# Patient Record
Sex: Female | Born: 1970 | Race: White | Hispanic: No | Marital: Married | Smoking: Current every day smoker
Health system: Southern US, Community
[De-identification: ages and names within clinical notes are randomized; demographics above are authoritative.]

## PROBLEM LIST (undated history)

## (undated) DIAGNOSIS — M1990 Unspecified osteoarthritis, unspecified site: Secondary | ICD-10-CM

## (undated) DIAGNOSIS — K219 Gastro-esophageal reflux disease without esophagitis: Secondary | ICD-10-CM

## (undated) DIAGNOSIS — J45909 Unspecified asthma, uncomplicated: Secondary | ICD-10-CM

## (undated) DIAGNOSIS — E119 Type 2 diabetes mellitus without complications: Secondary | ICD-10-CM

## (undated) HISTORY — PX: TONSILLECTOMY: SUR1361

---

## 1993-07-26 HISTORY — PX: CARPAL TUNNEL RELEASE: SHX101

## 2000-06-20 ENCOUNTER — Other Ambulatory Visit: Admission: RE | Admit: 2000-06-20 | Discharge: 2000-06-20 | Payer: Self-pay | Admitting: *Deleted

## 2001-08-11 ENCOUNTER — Other Ambulatory Visit: Admission: RE | Admit: 2001-08-11 | Discharge: 2001-08-11 | Payer: Self-pay | Admitting: *Deleted

## 2002-08-29 ENCOUNTER — Other Ambulatory Visit: Admission: RE | Admit: 2002-08-29 | Discharge: 2002-08-29 | Payer: Self-pay | Admitting: Obstetrics & Gynecology

## 2015-06-26 HISTORY — PX: CHOLECYSTECTOMY: SHX55

## 2016-05-04 ENCOUNTER — Other Ambulatory Visit: Payer: Self-pay | Admitting: Pain Medicine

## 2016-05-04 DIAGNOSIS — M545 Low back pain, unspecified: Secondary | ICD-10-CM

## 2016-05-04 DIAGNOSIS — G8929 Other chronic pain: Secondary | ICD-10-CM

## 2016-05-16 ENCOUNTER — Ambulatory Visit
Admission: RE | Admit: 2016-05-16 | Discharge: 2016-05-16 | Disposition: A | Discharge status: Home / Self Care | Payer: 59 | Source: Ambulatory Visit | Attending: Pain Medicine | Admitting: Pain Medicine

## 2016-05-16 DIAGNOSIS — M545 Low back pain, unspecified: Secondary | ICD-10-CM

## 2016-05-16 DIAGNOSIS — G8929 Other chronic pain: Secondary | ICD-10-CM

## 2016-07-27 DIAGNOSIS — M545 Low back pain: Secondary | ICD-10-CM | POA: Diagnosis not present

## 2016-07-27 DIAGNOSIS — Z79899 Other long term (current) drug therapy: Secondary | ICD-10-CM | POA: Diagnosis not present

## 2016-07-27 DIAGNOSIS — G894 Chronic pain syndrome: Secondary | ICD-10-CM | POA: Diagnosis not present

## 2016-07-27 DIAGNOSIS — Z79891 Long term (current) use of opiate analgesic: Secondary | ICD-10-CM | POA: Diagnosis not present

## 2016-07-27 DIAGNOSIS — M47817 Spondylosis without myelopathy or radiculopathy, lumbosacral region: Secondary | ICD-10-CM | POA: Diagnosis not present

## 2016-08-09 DIAGNOSIS — J0100 Acute maxillary sinusitis, unspecified: Secondary | ICD-10-CM | POA: Diagnosis not present

## 2016-08-24 DIAGNOSIS — M47817 Spondylosis without myelopathy or radiculopathy, lumbosacral region: Secondary | ICD-10-CM | POA: Diagnosis not present

## 2016-08-24 DIAGNOSIS — M5137 Other intervertebral disc degeneration, lumbosacral region: Secondary | ICD-10-CM | POA: Diagnosis not present

## 2016-08-24 DIAGNOSIS — Z79891 Long term (current) use of opiate analgesic: Secondary | ICD-10-CM | POA: Diagnosis not present

## 2016-08-24 DIAGNOSIS — G894 Chronic pain syndrome: Secondary | ICD-10-CM | POA: Diagnosis not present

## 2016-08-24 DIAGNOSIS — Z79899 Other long term (current) drug therapy: Secondary | ICD-10-CM | POA: Diagnosis not present

## 2016-09-08 DIAGNOSIS — M19019 Primary osteoarthritis, unspecified shoulder: Secondary | ICD-10-CM | POA: Diagnosis not present

## 2016-09-21 DIAGNOSIS — G894 Chronic pain syndrome: Secondary | ICD-10-CM | POA: Diagnosis not present

## 2016-09-21 DIAGNOSIS — Z79891 Long term (current) use of opiate analgesic: Secondary | ICD-10-CM | POA: Diagnosis not present

## 2016-09-21 DIAGNOSIS — M5137 Other intervertebral disc degeneration, lumbosacral region: Secondary | ICD-10-CM | POA: Diagnosis not present

## 2016-09-21 DIAGNOSIS — M47817 Spondylosis without myelopathy or radiculopathy, lumbosacral region: Secondary | ICD-10-CM | POA: Diagnosis not present

## 2016-09-21 DIAGNOSIS — Z79899 Other long term (current) drug therapy: Secondary | ICD-10-CM | POA: Diagnosis not present

## 2016-10-19 DIAGNOSIS — Z79891 Long term (current) use of opiate analgesic: Secondary | ICD-10-CM | POA: Diagnosis not present

## 2016-10-19 DIAGNOSIS — M5137 Other intervertebral disc degeneration, lumbosacral region: Secondary | ICD-10-CM | POA: Diagnosis not present

## 2016-10-19 DIAGNOSIS — Z79899 Other long term (current) drug therapy: Secondary | ICD-10-CM | POA: Diagnosis not present

## 2016-10-19 DIAGNOSIS — M47817 Spondylosis without myelopathy or radiculopathy, lumbosacral region: Secondary | ICD-10-CM | POA: Diagnosis not present

## 2016-10-19 DIAGNOSIS — G894 Chronic pain syndrome: Secondary | ICD-10-CM | POA: Diagnosis not present

## 2016-11-03 DIAGNOSIS — E1165 Type 2 diabetes mellitus with hyperglycemia: Secondary | ICD-10-CM | POA: Diagnosis not present

## 2016-11-03 DIAGNOSIS — E1159 Type 2 diabetes mellitus with other circulatory complications: Secondary | ICD-10-CM | POA: Diagnosis not present

## 2016-11-03 DIAGNOSIS — I10 Essential (primary) hypertension: Secondary | ICD-10-CM | POA: Diagnosis not present

## 2016-11-03 DIAGNOSIS — E785 Hyperlipidemia, unspecified: Secondary | ICD-10-CM | POA: Diagnosis not present

## 2016-11-16 DIAGNOSIS — G894 Chronic pain syndrome: Secondary | ICD-10-CM | POA: Diagnosis not present

## 2016-11-16 DIAGNOSIS — M545 Low back pain: Secondary | ICD-10-CM | POA: Diagnosis not present

## 2016-11-16 DIAGNOSIS — M47817 Spondylosis without myelopathy or radiculopathy, lumbosacral region: Secondary | ICD-10-CM | POA: Diagnosis not present

## 2016-11-25 DIAGNOSIS — M5137 Other intervertebral disc degeneration, lumbosacral region: Secondary | ICD-10-CM | POA: Diagnosis not present

## 2016-11-25 DIAGNOSIS — M9983 Other biomechanical lesions of lumbar region: Secondary | ICD-10-CM | POA: Diagnosis not present

## 2016-12-10 ENCOUNTER — Other Ambulatory Visit: Payer: Self-pay | Admitting: Neurosurgery

## 2016-12-15 DIAGNOSIS — G894 Chronic pain syndrome: Secondary | ICD-10-CM | POA: Diagnosis not present

## 2016-12-15 DIAGNOSIS — M47817 Spondylosis without myelopathy or radiculopathy, lumbosacral region: Secondary | ICD-10-CM | POA: Diagnosis not present

## 2016-12-15 DIAGNOSIS — M5137 Other intervertebral disc degeneration, lumbosacral region: Secondary | ICD-10-CM | POA: Diagnosis not present

## 2017-01-12 DIAGNOSIS — G894 Chronic pain syndrome: Secondary | ICD-10-CM | POA: Diagnosis not present

## 2017-01-12 DIAGNOSIS — M47817 Spondylosis without myelopathy or radiculopathy, lumbosacral region: Secondary | ICD-10-CM | POA: Diagnosis not present

## 2017-01-12 DIAGNOSIS — M5137 Other intervertebral disc degeneration, lumbosacral region: Secondary | ICD-10-CM | POA: Diagnosis not present

## 2017-01-12 DIAGNOSIS — Z79891 Long term (current) use of opiate analgesic: Secondary | ICD-10-CM | POA: Diagnosis not present

## 2017-01-12 DIAGNOSIS — Z79899 Other long term (current) drug therapy: Secondary | ICD-10-CM | POA: Diagnosis not present

## 2017-01-14 ENCOUNTER — Encounter (HOSPITAL_COMMUNITY)
Admission: RE | Admit: 2017-01-14 | Discharge: 2017-01-14 | Disposition: A | Discharge status: Home / Self Care | Payer: 59 | Source: Ambulatory Visit | Attending: Neurosurgery | Admitting: Neurosurgery

## 2017-01-14 ENCOUNTER — Encounter (HOSPITAL_COMMUNITY): Payer: Self-pay | Admitting: *Deleted

## 2017-01-14 DIAGNOSIS — Z01812 Encounter for preprocedural laboratory examination: Secondary | ICD-10-CM | POA: Diagnosis not present

## 2017-01-14 DIAGNOSIS — Z01810 Encounter for preprocedural cardiovascular examination: Secondary | ICD-10-CM | POA: Diagnosis not present

## 2017-01-14 DIAGNOSIS — M5136 Other intervertebral disc degeneration, lumbar region: Secondary | ICD-10-CM | POA: Insufficient documentation

## 2017-01-14 HISTORY — DX: Unspecified osteoarthritis, unspecified site: M19.90

## 2017-01-14 HISTORY — DX: Unspecified asthma, uncomplicated: J45.909

## 2017-01-14 HISTORY — DX: Gastro-esophageal reflux disease without esophagitis: K21.9

## 2017-01-14 HISTORY — DX: Type 2 diabetes mellitus without complications: E11.9

## 2017-01-14 LAB — TYPE AND SCREEN
ABO/RH(D): A POS
Antibody Screen: NEGATIVE

## 2017-01-14 LAB — GLUCOSE, CAPILLARY: Glucose-Capillary: 115 mg/dL — ABNORMAL HIGH (ref 65–99)

## 2017-01-14 LAB — BASIC METABOLIC PANEL
ANION GAP: 8 (ref 5–15)
BUN: 14 mg/dL (ref 6–20)
CALCIUM: 9.1 mg/dL (ref 8.9–10.3)
CHLORIDE: 105 mmol/L (ref 101–111)
CO2: 23 mmol/L (ref 22–32)
CREATININE: 0.7 mg/dL (ref 0.44–1.00)
GFR calc non Af Amer: >60 mL/min (ref >60)
Glucose, Bld: 87 mg/dL (ref 65–99)
Potassium: 4.2 mmol/L (ref 3.5–5.1)
SODIUM: 136 mmol/L (ref 135–145)

## 2017-01-14 LAB — CBC WITH DIFFERENTIAL/PLATELET
Basophils Absolute: 0 10*3/uL (ref 0.0–0.1)
Basophils Relative: 0 %
EOS ABS: 0.1 10*3/uL (ref 0.0–0.7)
EOS PCT: 1 %
HCT: 47.1 % — ABNORMAL HIGH (ref 36.0–46.0)
Hemoglobin: 15.9 g/dL — ABNORMAL HIGH (ref 12.0–15.0)
LYMPHS ABS: 3.4 10*3/uL (ref 0.7–4.0)
LYMPHS PCT: 32 %
MCH: 30.5 pg (ref 26.0–34.0)
MCHC: 33.8 g/dL (ref 30.0–36.0)
MCV: 90.2 fL (ref 78.0–100.0)
MONO ABS: 0.7 10*3/uL (ref 0.1–1.0)
MONOS PCT: 6 %
Neutro Abs: 6.6 10*3/uL (ref 1.7–7.7)
Neutrophils Relative %: 61 %
PLATELETS: 293 10*3/uL (ref 150–400)
RBC: 5.22 MIL/uL — AB (ref 3.87–5.11)
RDW: 13.2 % (ref 11.5–15.5)
WBC: 10.8 10*3/uL — ABNORMAL HIGH (ref 4.0–10.5)

## 2017-01-14 LAB — ABO/RH: ABO/RH(D): A POS

## 2017-01-14 LAB — SURGICAL PCR SCREEN
MRSA, PCR: NEGATIVE
Staphylococcus aureus: NEGATIVE

## 2017-01-14 LAB — HCG, SERUM, QUALITATIVE: PREG SERUM: NEGATIVE

## 2017-01-14 NOTE — Progress Notes (Signed)
PCP: [facility] in [place], [place]  Fasting sugars 98-110

## 2017-01-14 NOTE — Pre-Procedure Instructions (Addendum)
Carla Parker  01/14/2017      [id number] - [place], [place] - [address] AT [facility] [address] [phone] [place] [address] Phone: [phone] Fax: [phone]    Your procedure is scheduled on Mon. July 2  Report to [facility] Admitting at 6:00 A.M.  Call this number if you have problems the morning of surgery:  [phone]   Remember:  Do not eat food or drink liquids after midnight on Sun. July 1   Take these medicines the morning of surgery with A SIP OF WATER :baclofen if needed, omeprazole(prilosec), oxycodone if needed, requip            1 week prior to surgery stop: advil, motrin, mobic, ibuprofen,BC Powders, Goody's, vitamins/herbal medicines.    How to Manage Your Diabetes Before and After Surgery  Why is it important to control my blood sugar before and after surgery? . Improving blood sugar levels before and after surgery helps healing and can limit problems. . A way of improving blood sugar control is eating a healthy diet by: o  Eating less sugar and carbohydrates o  Increasing activity/exercise o  Talking with your doctor about reaching your blood sugar goals . High blood sugars (greater than 180 mg/dL) can raise your risk of infections and slow your recovery, so you will need to focus on controlling your diabetes during the weeks before surgery. . Make sure that the doctor who takes care of your diabetes knows about your planned surgery including the date and location.  How do I manage my blood sugar before surgery? . Check your blood sugar at least 4 times a day, starting 2 days before surgery, to make sure that the level is not too high or low. o Check your blood sugar the morning of your surgery when you wake up and every 2 hours until you get to the Short Stay unit. . If your blood sugar is less than 70 mg/dL, you will need to treat for low blood sugar: o Do not take insulin. o Treat a low blood sugar  (less than 70 mg/dL) with  cup of clear juice (cranberry or apple), 4 glucose tablets, OR glucose gel. o Recheck blood sugar in 15 minutes after treatment (to make sure it is greater than 70 mg/dL). If your blood sugar is not greater than 70 mg/dL on recheck, call [phone] for further instructions. . Report your blood sugar to the short stay nurse when you get to Short Stay.  . If you are admitted to the hospital after surgery: o Your blood sugar will be checked by the staff and you will probably be given insulin after surgery (instead of oral diabetes medicines) to make sure you have good blood sugar levels. o The goal for blood sugar control after surgery is 80-180 mg/dL.       WHAT DO I DO ABOUT MY DIABETES MEDICATION?   Marland Kitchen Do not take oral diabetes medicines (pills) the morning of surgery.       Do not wear jewelry, make-up or nail polish.  Do not wear lotions, powders, or perfumes, or deoderant.  Do not shave 48 hours prior to surgery.  Men may shave face and neck.  Do not bring valuables to the hospital.  [facility] is not responsible for any belongings or valuables.  Contacts, dentures or bridgework may not be worn into surgery.  Leave your suitcase in the car.  After surgery it may be brought to your room.  For patients admitted to the hospital, discharge time will be determined by your treatment team.  Patients discharged the day of surgery will not be allowed to drive home.    Special instructions:   [place]- Preparing For Surgery  Before surgery, you can play an important role. Because skin is not sterile, your skin needs to be as free of germs as possible. You can reduce the number of germs on your skin by washing with CHG (chlorahexidine gluconate) Soap before surgery.  CHG is an antiseptic cleaner which kills germs and bonds with the skin to continue killing germs even after washing.  Please do not use if you have an allergy to CHG or antibacterial soaps.  If your skin becomes reddened/irritated stop using the CHG.  Do not shave (including legs and underarms) for at least 48 hours prior to first CHG shower. It is OK to shave your face.  Please follow these instructions carefully.   1. Shower the NIGHT BEFORE SURGERY and the MORNING OF SURGERY with CHG.   2. If you chose to wash your hair, wash your hair first as usual with your normal shampoo.  3. After you shampoo, rinse your hair and body thoroughly to remove the shampoo.  4. Use CHG as you would any other liquid soap. You can apply CHG directly to the skin and wash gently with a scrungie or a clean washcloth.   5. Apply the CHG Soap to your body ONLY FROM THE NECK DOWN.  Do not use on open wounds or open sores. Avoid contact with your eyes, ears, mouth and genitals (private parts). Wash genitals (private parts) with your normal soap.  6. Wash thoroughly, paying special attention to the area where your surgery will be performed.  7. Thoroughly rinse your body with warm water from the neck down.  8. DO NOT shower/wash with your normal soap after using and rinsing off the CHG Soap.  9. Pat yourself dry with a CLEAN TOWEL.   10. Wear CLEAN PAJAMAS   11. Place CLEAN SHEETS on your bed the night of your first shower and DO NOT SLEEP WITH PETS.    Day of Surgery: Do not apply any deodorants/lotions. Please wear clean clothes to the hospital/surgery center.      Please read over the following fact sheets that you were given. Coughing and Deep Breathing, MRSA Information and Surgical Site Infection Prevention

## 2017-01-15 LAB — HEMOGLOBIN A1C
HEMOGLOBIN A1C: 6.7 % — AB (ref 4.8–5.6)
MEAN PLASMA GLUCOSE: 146 mg/dL

## 2017-01-23 MED ORDER — DEXAMETHASONE SODIUM PHOSPHATE 10 MG/ML IJ SOLN
10.0000 mg | INTRAMUSCULAR | Status: AC
  Administered 2017-01-24: 10 mg via INTRAVENOUS
  Filled 2017-01-23: qty 1

## 2017-01-23 MED ORDER — DEXTROSE 5 % IV SOLN
3.0000 g | INTRAVENOUS | Status: AC
  Administered 2017-01-24: 3 g via INTRAVENOUS
  Filled 2017-01-23 (×2): qty 3000

## 2017-01-24 ENCOUNTER — Inpatient Hospital Stay (HOSPITAL_COMMUNITY)
Admission: RE | Admit: 2017-01-24 | Discharge: 2017-01-25 | DRG: 455 | Disposition: A | Discharge status: Home / Self Care | Payer: 59 | Source: Ambulatory Visit | Attending: Neurosurgery | Admitting: Neurosurgery

## 2017-01-24 ENCOUNTER — Inpatient Hospital Stay (HOSPITAL_COMMUNITY): Payer: 59 | Admitting: Certified Registered Nurse Anesthetist

## 2017-01-24 ENCOUNTER — Inpatient Hospital Stay (HOSPITAL_COMMUNITY)
Admission: RE | Disposition: A | Discharge status: Home / Self Care | Payer: Self-pay | Source: Ambulatory Visit | Attending: Neurosurgery

## 2017-01-24 ENCOUNTER — Inpatient Hospital Stay (HOSPITAL_COMMUNITY): Payer: 59

## 2017-01-24 DIAGNOSIS — M4807 Spinal stenosis, lumbosacral region: Secondary | ICD-10-CM | POA: Diagnosis present

## 2017-01-24 DIAGNOSIS — M48061 Spinal stenosis, lumbar region without neurogenic claudication: Secondary | ICD-10-CM | POA: Diagnosis present

## 2017-01-24 DIAGNOSIS — E119 Type 2 diabetes mellitus without complications: Secondary | ICD-10-CM | POA: Diagnosis present

## 2017-01-24 DIAGNOSIS — F17210 Nicotine dependence, cigarettes, uncomplicated: Secondary | ICD-10-CM | POA: Diagnosis present

## 2017-01-24 DIAGNOSIS — Z7984 Long term (current) use of oral hypoglycemic drugs: Secondary | ICD-10-CM

## 2017-01-24 DIAGNOSIS — M5127 Other intervertebral disc displacement, lumbosacral region: Secondary | ICD-10-CM | POA: Diagnosis not present

## 2017-01-24 DIAGNOSIS — Z880 Allergy status to penicillin: Secondary | ICD-10-CM

## 2017-01-24 DIAGNOSIS — M5137 Other intervertebral disc degeneration, lumbosacral region: Secondary | ICD-10-CM | POA: Diagnosis not present

## 2017-01-24 DIAGNOSIS — Z885 Allergy status to narcotic agent status: Secondary | ICD-10-CM

## 2017-01-24 DIAGNOSIS — Z791 Long term (current) use of non-steroidal anti-inflammatories (NSAID): Secondary | ICD-10-CM

## 2017-01-24 DIAGNOSIS — K219 Gastro-esophageal reflux disease without esophagitis: Secondary | ICD-10-CM | POA: Diagnosis present

## 2017-01-24 DIAGNOSIS — M5117 Intervertebral disc disorders with radiculopathy, lumbosacral region: Principal | ICD-10-CM | POA: Diagnosis present

## 2017-01-24 DIAGNOSIS — Z419 Encounter for procedure for purposes other than remedying health state, unspecified: Secondary | ICD-10-CM

## 2017-01-24 DIAGNOSIS — M4326 Fusion of spine, lumbar region: Secondary | ICD-10-CM | POA: Diagnosis not present

## 2017-01-24 LAB — GLUCOSE, CAPILLARY
GLUCOSE-CAPILLARY: 143 mg/dL — AB (ref 65–99)
GLUCOSE-CAPILLARY: 148 mg/dL — AB (ref 65–99)
Glucose-Capillary: 182 mg/dL — ABNORMAL HIGH (ref 65–99)
Glucose-Capillary: 136 mg/dL — ABNORMAL HIGH (ref 65–99)
Glucose-Capillary: 158 mg/dL — ABNORMAL HIGH (ref 65–99)

## 2017-01-24 SURGERY — POSTERIOR LUMBAR FUSION 1 LEVEL
Anesthesia: General | Site: Back

## 2017-01-24 MED ORDER — 0.9 % SODIUM CHLORIDE (POUR BTL) OPTIME
TOPICAL | Status: DC | PRN
  Administered 2017-01-24: 1000 mL

## 2017-01-24 MED ORDER — HYDROMORPHONE HCL 1 MG/ML IJ SOLN
INTRAMUSCULAR | Status: AC
  Administered 2017-01-24: 0.5 mg via INTRAVENOUS
  Filled 2017-01-24: qty 1

## 2017-01-24 MED ORDER — MIDAZOLAM HCL 2 MG/2ML IJ SOLN
INTRAMUSCULAR | Status: AC
  Filled 2017-01-24: qty 2

## 2017-01-24 MED ORDER — FENTANYL CITRATE (PF) 250 MCG/5ML IJ SOLN
INTRAMUSCULAR | Status: DC | PRN
  Administered 2017-01-24 (×5): 50 ug via INTRAVENOUS
  Administered 2017-01-24: 100 ug via INTRAVENOUS
  Administered 2017-01-24 (×5): 50 ug via INTRAVENOUS

## 2017-01-24 MED ORDER — DIAZEPAM 5 MG PO TABS
5.0000 mg | ORAL_TABLET | Freq: Four times a day (QID) | ORAL | Status: DC | PRN
  Administered 2017-01-24: 10 mg via ORAL
  Administered 2017-01-24 – 2017-01-25 (×2): 5 mg via ORAL
  Filled 2017-01-24: qty 2
  Filled 2017-01-24 (×2): qty 1

## 2017-01-24 MED ORDER — ACETAMINOPHEN 10 MG/ML IV SOLN
INTRAVENOUS | Status: AC
  Filled 2017-01-24: qty 100

## 2017-01-24 MED ORDER — ONDANSETRON HCL 4 MG/2ML IJ SOLN
INTRAMUSCULAR | Status: DC | PRN
  Administered 2017-01-24: 4 mg via INTRAVENOUS

## 2017-01-24 MED ORDER — CEFAZOLIN SODIUM-DEXTROSE 1-4 GM/50ML-% IV SOLN
1.0000 g | Freq: Three times a day (TID) | INTRAVENOUS | Status: AC
  Administered 2017-01-24 – 2017-01-25 (×2): 1 g via INTRAVENOUS
  Filled 2017-01-24 (×2): qty 50

## 2017-01-24 MED ORDER — SUGAMMADEX SODIUM 200 MG/2ML IV SOLN
INTRAVENOUS | Status: DC | PRN
  Administered 2017-01-24: 200 mg via INTRAVENOUS

## 2017-01-24 MED ORDER — MENTHOL 3 MG MT LOZG: 1.0000 | LOZENGE | OROMUCOSAL | Status: DC | PRN

## 2017-01-24 MED ORDER — THROMBIN 20000 UNITS EX SOLR
CUTANEOUS | Status: DC | PRN
  Administered 2017-01-24: 20 mL via TOPICAL

## 2017-01-24 MED ORDER — ACETAMINOPHEN 10 MG/ML IV SOLN
INTRAVENOUS | Status: DC | PRN
  Administered 2017-01-24: 1000 mg via INTRAVENOUS

## 2017-01-24 MED ORDER — CALCIUM-MAGNESIUM-VITAMIN D ER 600-40-500 MG-MG-UNIT PO TB24: ORAL_TABLET | Freq: Every day | ORAL | Status: DC

## 2017-01-24 MED ORDER — CANAGLIFLOZIN 300 MG PO TABS
300.0000 mg | ORAL_TABLET | Freq: Every day | ORAL | Status: DC
  Administered 2017-01-24 – 2017-01-25 (×2): 300 mg via ORAL
  Filled 2017-01-24 (×2): qty 1

## 2017-01-24 MED ORDER — ROPINIROLE HCL 1 MG PO TABS
8.0000 mg | ORAL_TABLET | Freq: Two times a day (BID) | ORAL | Status: DC
  Administered 2017-01-24 (×2): 8 mg via ORAL
  Filled 2017-01-24 (×2): qty 8

## 2017-01-24 MED ORDER — HYDROCODONE-ACETAMINOPHEN 10-325 MG PO TABS
1.0000 | ORAL_TABLET | ORAL | Status: DC | PRN
Start: 2017-01-24 — End: 2017-01-25
  Administered 2017-01-25: 2 via ORAL
  Filled 2017-01-24: qty 2

## 2017-01-24 MED ORDER — FENTANYL CITRATE (PF) 250 MCG/5ML IJ SOLN
INTRAMUSCULAR | Status: AC
  Filled 2017-01-24: qty 5

## 2017-01-24 MED ORDER — PRAVASTATIN SODIUM 20 MG PO TABS
20.0000 mg | ORAL_TABLET | Freq: Every day | ORAL | Status: DC
  Administered 2017-01-24: 20 mg via ORAL
  Filled 2017-01-24: qty 1

## 2017-01-24 MED ORDER — PANTOPRAZOLE SODIUM 40 MG PO TBEC
40.0000 mg | DELAYED_RELEASE_TABLET | Freq: Every day | ORAL | Status: DC
Start: 2017-01-24 — End: 2017-01-25
  Administered 2017-01-24 – 2017-01-25 (×2): 40 mg via ORAL
  Filled 2017-01-24 (×2): qty 1

## 2017-01-24 MED ORDER — ARTIFICIAL TEARS OPHTHALMIC OINT
TOPICAL_OINTMENT | OPHTHALMIC | Status: AC
  Filled 2017-01-24: qty 7

## 2017-01-24 MED ORDER — THROMBIN 20000 UNITS EX SOLR
CUTANEOUS | Status: AC
  Filled 2017-01-24: qty 20000

## 2017-01-24 MED ORDER — PROPOFOL 10 MG/ML IV BOLUS
INTRAVENOUS | Status: AC
  Filled 2017-01-24: qty 20

## 2017-01-24 MED ORDER — SODIUM CHLORIDE 0.9% FLUSH
3.0000 mL | Freq: Two times a day (BID) | INTRAVENOUS | Status: DC
  Administered 2017-01-24: 3 mL via INTRAVENOUS

## 2017-01-24 MED ORDER — BUPIVACAINE HCL (PF) 0.25 % IJ SOLN
INTRAMUSCULAR | Status: AC
  Filled 2017-01-24: qty 30

## 2017-01-24 MED ORDER — OXYCODONE HCL 5 MG PO TABS
5.0000 mg | ORAL_TABLET | ORAL | Status: AC | PRN
  Administered 2017-01-24: 5 mg via ORAL

## 2017-01-24 MED ORDER — OXYCODONE-ACETAMINOPHEN 5-325 MG PO TABS
1.0000 | ORAL_TABLET | ORAL | Status: DC | PRN
  Administered 2017-01-24 – 2017-01-25 (×5): 2 via ORAL
  Filled 2017-01-24 (×5): qty 2

## 2017-01-24 MED ORDER — LACTATED RINGERS IV SOLN
INTRAVENOUS | Status: DC | PRN
  Administered 2017-01-24 (×2): via INTRAVENOUS

## 2017-01-24 MED ORDER — THROMBIN 5000 UNITS EX SOLR
CUTANEOUS | Status: AC
  Filled 2017-01-24: qty 5000

## 2017-01-24 MED ORDER — HYDROMORPHONE HCL 1 MG/ML IJ SOLN
0.2500 mg | INTRAMUSCULAR | Status: DC | PRN
  Administered 2017-01-24 (×4): 0.5 mg via INTRAVENOUS

## 2017-01-24 MED ORDER — VITAMIN D 1000 UNITS PO TABS
2000.0000 [IU] | ORAL_TABLET | Freq: Every day | ORAL | Status: DC
  Administered 2017-01-25: 2000 [IU] via ORAL
  Filled 2017-01-24: qty 2

## 2017-01-24 MED ORDER — METFORMIN HCL ER 500 MG PO TB24
500.0000 mg | ORAL_TABLET | Freq: Every day | ORAL | Status: DC
  Administered 2017-01-24 – 2017-01-25 (×2): 500 mg via ORAL
  Filled 2017-01-24 (×2): qty 1

## 2017-01-24 MED ORDER — BACLOFEN 10 MG PO TABS
10.0000 mg | ORAL_TABLET | Freq: Every evening | ORAL | Status: DC | PRN
  Filled 2017-01-24: qty 1

## 2017-01-24 MED ORDER — PROMETHAZINE HCL 25 MG/ML IJ SOLN: 6.2500 mg | INTRAMUSCULAR | Status: DC | PRN

## 2017-01-24 MED ORDER — MELOXICAM 7.5 MG PO TABS
7.5000 mg | ORAL_TABLET | Freq: Two times a day (BID) | ORAL | Status: DC
  Administered 2017-01-24 – 2017-01-25 (×2): 7.5 mg via ORAL
  Filled 2017-01-24 (×2): qty 1

## 2017-01-24 MED ORDER — LIDOCAINE 2% (20 MG/ML) 5 ML SYRINGE
INTRAMUSCULAR | Status: DC | PRN
  Administered 2017-01-24: 60 mg via INTRAVENOUS

## 2017-01-24 MED ORDER — PHENOL 1.4 % MT LIQD: 1.0000 | OROMUCOSAL | Status: DC | PRN

## 2017-01-24 MED ORDER — VANCOMYCIN HCL 1000 MG IV SOLR
INTRAVENOUS | Status: AC
  Filled 2017-01-24: qty 1000

## 2017-01-24 MED ORDER — DEXAMETHASONE SODIUM PHOSPHATE 10 MG/ML IJ SOLN
INTRAMUSCULAR | Status: AC
  Filled 2017-01-24: qty 1

## 2017-01-24 MED ORDER — MIDAZOLAM HCL 2 MG/2ML IJ SOLN
INTRAMUSCULAR | Status: DC | PRN
  Administered 2017-01-24: 2 mg via INTRAVENOUS

## 2017-01-24 MED ORDER — ONDANSETRON HCL 4 MG/2ML IJ SOLN: 4.0000 mg | Freq: Four times a day (QID) | INTRAMUSCULAR | Status: DC | PRN

## 2017-01-24 MED ORDER — OXYCODONE-ACETAMINOPHEN 7.5-325 MG PO TABS: 1.0000 | ORAL_TABLET | ORAL | Status: DC | PRN

## 2017-01-24 MED ORDER — HYDROMORPHONE HCL 1 MG/ML IJ SOLN
0.5000 mg | INTRAMUSCULAR | Status: DC | PRN
  Administered 2017-01-24: 1 mg via INTRAVENOUS
  Administered 2017-01-24: 0.5 mg via INTRAVENOUS
  Filled 2017-01-24: qty 0.5
  Filled 2017-01-24: qty 1

## 2017-01-24 MED ORDER — SODIUM CHLORIDE 0.9% FLUSH: 3.0000 mL | INTRAVENOUS | Status: DC | PRN

## 2017-01-24 MED ORDER — ROCURONIUM BROMIDE 10 MG/ML (PF) SYRINGE
PREFILLED_SYRINGE | INTRAVENOUS | Status: DC | PRN
  Administered 2017-01-24: 70 mg via INTRAVENOUS
  Administered 2017-01-24: 30 mg via INTRAVENOUS

## 2017-01-24 MED ORDER — VANCOMYCIN HCL 1000 MG IV SOLR
INTRAVENOUS | Status: DC | PRN
  Administered 2017-01-24: 1000 mg

## 2017-01-24 MED ORDER — OXYCODONE HCL 5 MG PO TABS
ORAL_TABLET | ORAL | Status: AC
  Filled 2017-01-24: qty 1

## 2017-01-24 MED ORDER — BUPIVACAINE HCL (PF) 0.25 % IJ SOLN
INTRAMUSCULAR | Status: DC | PRN
  Administered 2017-01-24: 20 mL

## 2017-01-24 MED ORDER — ONDANSETRON HCL 4 MG PO TABS: 4.0000 mg | ORAL_TABLET | Freq: Four times a day (QID) | ORAL | Status: DC | PRN

## 2017-01-24 MED ORDER — SODIUM CHLORIDE 0.9 % IV SOLN: 250.0000 mL | INTRAVENOUS | Status: DC

## 2017-01-24 MED ORDER — OXYCODONE HCL 5 MG PO TABS
2.5000 mg | ORAL_TABLET | ORAL | Status: DC | PRN
  Administered 2017-01-24 – 2017-01-25 (×3): 5 mg via ORAL
  Filled 2017-01-24 (×3): qty 1

## 2017-01-24 MED ORDER — PROPOFOL 10 MG/ML IV BOLUS
INTRAVENOUS | Status: DC | PRN
  Administered 2017-01-24: 150 mg via INTRAVENOUS

## 2017-01-24 MED ORDER — BACITRACIN 50000 UNITS IM SOLR
INTRAMUSCULAR | Status: DC | PRN
  Administered 2017-01-24: 500 mL

## 2017-01-24 MED ORDER — SUGAMMADEX SODIUM 200 MG/2ML IV SOLN
INTRAVENOUS | Status: AC
  Filled 2017-01-24: qty 2

## 2017-01-24 MED ORDER — ONDANSETRON HCL 4 MG/2ML IJ SOLN
INTRAMUSCULAR | Status: AC
  Filled 2017-01-24: qty 2

## 2017-01-24 SURGICAL SUPPLY — 62 items
BAG DECANTER FOR FLEXI CONT (MISCELLANEOUS) ×2 IMPLANT
BENZOIN TINCTURE PRP APPL 2/3 (GAUZE/BANDAGES/DRESSINGS) ×2 IMPLANT
BLADE CLIPPER SURG (BLADE) IMPLANT
BUR CUTTER 7.0 ROUND (BURR) IMPLANT
BUR MATCHSTICK NEURO 3.0 LAGG (BURR) ×2 IMPLANT
CANISTER SUCT 3000ML PPV (MISCELLANEOUS) ×2 IMPLANT
CAP LCK SPNE (Orthopedic Implant) ×4 IMPLANT
CAP LOCK SPINE RADIUS (Orthopedic Implant) ×4 IMPLANT
CAP LOCKING (Orthopedic Implant) ×4 IMPLANT
CARTRIDGE OIL MAESTRO DRILL (MISCELLANEOUS) ×1 IMPLANT
CLSR STERI-STRIP ANTIMIC 1/2X4 (GAUZE/BANDAGES/DRESSINGS) ×2 IMPLANT
CONT SPEC 4OZ CLIKSEAL STRL BL (MISCELLANEOUS) ×2 IMPLANT
COVER BACK TABLE 60X90IN (DRAPES) ×2 IMPLANT
DECANTER SPIKE VIAL GLASS SM (MISCELLANEOUS) ×2 IMPLANT
DERMABOND ADVANCED (GAUZE/BANDAGES/DRESSINGS) ×1
DERMABOND ADVANCED .7 DNX12 (GAUZE/BANDAGES/DRESSINGS) ×1 IMPLANT
DEVICE INTERBODY ELEVATE 23X8 (Cage) ×2 IMPLANT
DIFFUSER DRILL AIR PNEUMATIC (MISCELLANEOUS) ×2 IMPLANT
DRAPE C-ARM 42X72 X-RAY (DRAPES) ×4 IMPLANT
DRAPE HALF SHEET 40X57 (DRAPES) IMPLANT
DRAPE LAPAROTOMY 100X72X124 (DRAPES) ×2 IMPLANT
DRAPE POUCH INSTRU U-SHP 10X18 (DRAPES) ×2 IMPLANT
DRAPE SURG 17X23 STRL (DRAPES) ×8 IMPLANT
DRSG OPSITE POSTOP 4X6 (GAUZE/BANDAGES/DRESSINGS) ×2 IMPLANT
DURAPREP 26ML APPLICATOR (WOUND CARE) ×2 IMPLANT
ELECT REM PT RETURN 9FT ADLT (ELECTROSURGICAL) ×2
ELECTRODE REM PT RTRN 9FT ADLT (ELECTROSURGICAL) ×1 IMPLANT
EVACUATOR 1/8 PVC DRAIN (DRAIN) IMPLANT
GAUZE SPONGE 4X4 12PLY STRL (GAUZE/BANDAGES/DRESSINGS) IMPLANT
GAUZE SPONGE 4X4 16PLY XRAY LF (GAUZE/BANDAGES/DRESSINGS) IMPLANT
GLOVE ECLIPSE 9.0 STRL (GLOVE) ×4 IMPLANT
GLOVE EXAM NITRILE LRG STRL (GLOVE) IMPLANT
GLOVE EXAM NITRILE XL STR (GLOVE) IMPLANT
GLOVE EXAM NITRILE XS STR PU (GLOVE) IMPLANT
GOWN STRL REUS W/ TWL LRG LVL3 (GOWN DISPOSABLE) ×2 IMPLANT
GOWN STRL REUS W/ TWL XL LVL3 (GOWN DISPOSABLE) ×1 IMPLANT
GOWN STRL REUS W/TWL 2XL LVL3 (GOWN DISPOSABLE) ×2 IMPLANT
GOWN STRL REUS W/TWL LRG LVL3 (GOWN DISPOSABLE) ×2
GOWN STRL REUS W/TWL XL LVL3 (GOWN DISPOSABLE) ×1
KIT BASIN OR (CUSTOM PROCEDURE TRAY) ×2 IMPLANT
KIT ROOM TURNOVER OR (KITS) ×2 IMPLANT
MILL MEDIUM DISP (BLADE) ×2 IMPLANT
NEEDLE HYPO 22GX1.5 SAFETY (NEEDLE) ×2 IMPLANT
NS IRRIG 1000ML POUR BTL (IV SOLUTION) ×2 IMPLANT
OIL CARTRIDGE MAESTRO DRILL (MISCELLANEOUS) ×2
PACK LAMINECTOMY NEURO (CUSTOM PROCEDURE TRAY) ×2 IMPLANT
ROD RADIUS 40MM (Neuro Prosthesis/Implant) ×2 IMPLANT
ROD SPNL 40X5.5XNS TI RDS (Neuro Prosthesis/Implant) ×2 IMPLANT
SCREW 5.75 X 635 (Screw) ×4 IMPLANT
SCREW 5.75X40M (Screw) ×4 IMPLANT
SPACER SPNL XLORDOTIC 23X8X (Cage) ×2 IMPLANT
SPCR SPNL XLORDOTIC 23X8X (Cage) ×2 IMPLANT
SPONGE SURGIFOAM ABS GEL 100 (HEMOSTASIS) ×2 IMPLANT
STRIP CLOSURE SKIN 1/2X4 (GAUZE/BANDAGES/DRESSINGS) ×4 IMPLANT
SUT VIC AB 0 CT1 18XCR BRD8 (SUTURE) ×1 IMPLANT
SUT VIC AB 0 CT1 8-18 (SUTURE) ×1
SUT VIC AB 2-0 CT1 18 (SUTURE) ×2 IMPLANT
SUT VIC AB 3-0 SH 8-18 (SUTURE) ×2 IMPLANT
TOWEL GREEN STERILE (TOWEL DISPOSABLE) ×2 IMPLANT
TOWEL GREEN STERILE FF (TOWEL DISPOSABLE) ×2 IMPLANT
TRAY FOLEY W/METER SILVER 16FR (SET/KITS/TRAYS/PACK) ×2 IMPLANT
WATER STERILE IRR 1000ML POUR (IV SOLUTION) ×2 IMPLANT

## 2017-01-24 NOTE — Anesthesia Procedure Notes (Signed)
Procedure Name: Intubation Date/Time: 01/24/2017 8:05 AM Performed by: Geraldo DockerSOLHEIM, Eileen Croswell SALOMON Pre-anesthesia Checklist: Patient identified, Patient being monitored, Timeout performed, Emergency Drugs available and Suction available Patient Re-evaluated:Patient Re-evaluated prior to inductionOxygen Delivery Method: Circle System Utilized Preoxygenation: Pre-oxygenation with 100% oxygen Intubation Type: IV induction Ventilation: Mask ventilation without difficulty Laryngoscope Size: Miller and 3 Grade View: Grade I Tube type: Oral Tube size: 7.5 mm Number of attempts: 1 Airway Equipment and Method: Stylet Placement Confirmation: ETT inserted through vocal cords under direct vision,  positive ETCO2 and breath sounds checked- equal and bilateral Secured at: 21 cm Tube secured with: Tape Dental Injury: Teeth and Oropharynx as per pre-operative assessment

## 2017-01-24 NOTE — Progress Notes (Signed)
Pt obtunded on arrival to PACU- somnolent - airway maintained by Sharia ReeveJosh, CRNA- pt switched over to NRB- -see VS

## 2017-01-24 NOTE — Op Note (Signed)
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  L5-S1 posterior lumbar interbody fusion utilizing interbody expandable cages and locally harvested autograft  L5-S1 posterior lateral arthrodesis utilizing screw fixation and local autograft.  Surgeon:Szymon Foiles A.Anson Peddie, M.D.  Asst. Surgeon: Franky Machoabbell  Anesthesia: General  Indication: 46 year old female with severe back and bilateral lower kidney symptoms failing conservative management. Workup demonstrates evidence of marked disc degeneration with disc space collapse and severe foraminal stenosis. Patient presents now for decompression and fusion in hopes of improving her symptoms.  Operative note: After induction of anesthesia, patient position prone onto Wilson frame and a properly padded. Lumbar region prepped and draped sterilely. Incision made overlying L5-S1. Dissection performed bilaterally. Retractor placed. Fluoroscopy used. Levels confirmed. Decompressive laminotomies were then performed using Leksell rongeurs and Kerrison rongeurs to remove the inferior two thirds the lamina of L5 the entire pars interarticularis of L5 bilaterally as well as the inferior facets of L5 bilaterally. Superior facetectomies also performed bilaterally. All bone is cleaned in use and later autografting. Wound plate and elevated and resected. Decompressive foraminotomies performed on course exiting L5 and S1 nerve roots bilaterally. Bilateral discectomies then performed at L5 and S1. Disc space then prepared for interbody fusion. With the distractor placed the patient's right side disc space was  scraped of soft tissue. An 8 mm extra lordotic Medtronic expandable cage packed with locally harvested autograft was impacted into place and expanded to its full extent. Distractor removed patient's right side. Disc space prepared on the right side. Soft tissue removed and interspace. Morselize autograft packed in the interspace. A second cage packed with autograft was then impacted into place and expanded to its full extent. Pedicles of L5 and S1 were then divided using surface landmarks and intraoperative fluoroscopy. Superficial bone of and pedicle was removed using high-speed drill. Each pedicles and probed using pedicle awl each pedicle awl track was then tapped with a screw tap. Each screw tap hole was probed and found to be solidly within bone. 5.75 mm radius brand screws from [employer] were placed bilaterally at L5 and S1. Final images revealed good position of the cages and the pedicle screws at the proper upper level with normal alignment spine. Wounds and irrigated one final time. Gelfoam was placed topically for hemostasis. Residual facets and transverse processes were decorticated. Morselize autograft was packed posterior laterally. Short segment titanium rod placed or the screw heads at L5 and S1. Locking caps placed over the screws. Locking caps and engaged with the construct under compression. Wound is then irrigated one final time. Vancomycin powder placed the deep wound space. Wounds and close in layers of Vicryl sutures. Steri-Strips and sterile dressing were applied. No apparent complications. Patient tolerated the procedure well and she returns to the recovery room postop.

## 2017-01-24 NOTE — Anesthesia Postprocedure Evaluation (Signed)
Anesthesia Post Note  Patient: Carla Parker  Procedure(s) Performed: Procedure(s) (LRB): POSTERIOR LUMBAR INTERBODY FUSION LUMBAR FIVE-SACRAL ONE (N/A)     Patient location during evaluation: PACU Anesthesia Type: General Level of consciousness: awake and alert Pain management: pain level controlled Vital Signs Assessment: post-procedure vital signs reviewed and stable Respiratory status: spontaneous breathing, nonlabored ventilation, respiratory function stable and patient connected to nasal cannula oxygen Cardiovascular status: blood pressure returned to baseline and stable Postop Assessment: no signs of nausea or vomiting Anesthetic complications: no    Last Vitals:  Vitals:   01/24/17 1011 01/24/17 1013  BP: 137/86   Pulse: 78 86  Resp: (!) 9 17  Temp:      Last Pain:  Vitals:   01/24/17 0636  TempSrc:   PainSc: 4                  Aquan Kope S

## 2017-01-24 NOTE — Evaluation (Signed)
Physical Therapy Evaluation Patient Details Name: Carla Parker MRN: [id number] DOB: 19-Jun-1971 Today's Date: 01/24/2017   History of Present Illness  Patient is a 46 y/o female who presents s/p L5-S1 PLIF. PMH includes DM.  Clinical Impression  Patient presents with pain and post surgical deficits s/p above surgery. Tolerated gait training holding onto IV pole for support. Limited mainly by pain. Education re: back precautions, log roll technique, back brace etc. Pt will have support from spouse for 1 week at d/c. Will plan for stair training tomorrow as tolerated. Will follow acutely to maximize independence and mobility prior to return home.     Follow Up Recommendations No PT follow up;Supervision for mobility/OOB    Equipment Recommendations  None recommended by PT    Recommendations for Other Services OT consult     Precautions / Restrictions Precautions Precautions: Back Precaution Booklet Issued: No Precaution Comments: Reviewed back precautions. Required Braces or Orthoses: Spinal Brace Spinal Brace: Lumbar corset;Applied in sitting position Restrictions Weight Bearing Restrictions: No      Mobility  Bed Mobility Overal bed mobility: Needs Assistance Bed Mobility: Rolling;Sidelying to Sit Rolling: Supervision Sidelying to sit: Supervision;HOB elevated       General bed mobility comments: Cues for log roll technique. Use of rail for support.  Transfers Overall transfer level: Needs assistance Equipment used: None Transfers: Sit to/from Stand Sit to Stand: Min guard         General transfer comment: Min guard for safety. Stood from [employer]stateEOB x1, from toilet x1. Transferred to chair post ambulation.   Ambulation/Gait Ambulation/Gait assistance: Min guard Ambulation Distance (Feet): 150 Feet Assistive device:  (IV pole) Gait Pattern/deviations: Step-through pattern;Decreased stride length;Trunk flexed;Drifts right/left Gait velocity: decreased Gait velocity  interpretation: <1.8 ft/sec, indicative of risk for recurrent falls General Gait Details: Slow, guarded gait using IV pole for support. 2 standing rest breaks.   Stairs            Wheelchair Mobility    Modified Rankin (Stroke Patients Only)       Balance Overall balance assessment: Needs assistance Sitting-balance support: Feet supported;No upper extremity supported Sitting balance-Leahy Scale: Good Sitting balance - Comments: Able to donn brace with setup.   Standing balance support: During functional activity Standing balance-Leahy Scale: Fair Standing balance comment: Able to stand at sink and wash hands reaching outside BoS without difficulty.                              Pertinent Vitals/Pain Pain Assessment: 0-10 Pain Score: 7  Pain Location: back Pain Descriptors / Indicators: Sore;Operative site guarding Pain Intervention(s): Monitored during session;Repositioned;Premedicated before session;Limited activity within patient's tolerance    Home Living Family/patient expects to be discharged to:: Private residence Living Arrangements: Spouse/significant other Available Help at Discharge: Family;Available 24 hours/day Type of Home: House Home Access: Stairs to enter   [employer] CorporationEntrance Stairs-Number of Steps: 2 Home Layout: One level Home Equipment: Shower seat      Prior Function Level of Independence: Independent         Comments: Works in [employer]- lots of sitting/standing.     Hand Dominance        Extremity/Trunk Assessment   Upper Extremity Assessment Upper Extremity Assessment: Defer to OT evaluation    Lower Extremity Assessment Lower Extremity Assessment: Generalized weakness (Sensation [facility]    Cervical / Trunk Assessment Cervical / Trunk Assessment: Other exceptions Cervical / Trunk Exceptions: s/p spine  surgery  Communication   Communication: No difficulties  Cognition Arousal/Alertness: Awake/alert Behavior During  Therapy: WFL for tasks assessed/performed Overall Cognitive Status: Within Functional Limits for tasks assessed                                        General Comments      Exercises     Assessment/Plan    PT Assessment Patient needs continued PT services  PT Problem List Decreased strength;Decreased mobility;Decreased balance;Pain;Decreased activity tolerance;Decreased knowledge of precautions;Decreased skin integrity       PT Treatment Interventions Therapeutic activities;Gait training;Therapeutic exercise;Patient/family education;Balance training;Functional mobility training;Stair training    PT Goals (Current goals can be found in the Care Plan section)  Acute Rehab PT Goals Patient Stated Goal: to get back to walking on treadmill PT Goal Formulation: With patient Time For Goal Achievement: 02/07/17 Potential to Achieve Goals: Good    Frequency Min 5X/week   Barriers to discharge        Co-evaluation               AM-PAC PT "6 Clicks" Daily Activity  Outcome Measure Difficulty turning over in bed (including adjusting bedclothes, sheets and blankets)?: None Difficulty moving from lying on back to sitting on the side of the bed? : None Difficulty sitting down on and standing up from a chair with arms (e.g., wheelchair, bedside commode, etc,.)?: None Help needed moving to and from a bed to chair (including a wheelchair)?: A Little Help needed walking in hospital room?: A Little Help needed climbing 3-5 steps with a railing? : A Little 6 Click Score: 21    End of Session Equipment Utilized During Treatment: Back brace;Gait belt Activity Tolerance: Patient tolerated treatment well;Patient limited by pain Patient left: in chair;with call bell/phone within reach Nurse Communication: Mobility status PT Visit Diagnosis: Pain;Muscle weakness (generalized) (M62.81) Pain - part of body:  (back)    Time: 1610-9604 PT Time Calculation (min) (ACUTE  ONLY): 24 min   Charges:   PT Evaluation $PT Eval Low Complexity: 1 Procedure PT Treatments $Gait Training: 8-22 mins   PT G Codes:        Mylo Red, PT, DPT [phone]    Carla Parker 01/24/2017, 4:15 PM

## 2017-01-24 NOTE — H&P (Signed)
Carla Parker is an 46 y.o. female.   Chief Complaint: Back pain HPI: 46 year old female with chronic and progressive back pain with bilateral lower extremity symptoms. Symptoms of failed conservative management. Workup demonstrates evidence of marked disc degeneration with disc space collapse and bilateral foraminal stenosis at L5-S1. Patient has failed conservative management presents now for lumbar decompression and fusion in hopes of improving her symptoms.  Past Medical History:  Diagnosis Date  . Arthritis   . Asthma    actvity induced asthma  . Diabetes mellitus without complication (HCC)   . GERD (gastroesophageal reflux disease)     Past Surgical History:  Procedure Laterality Date  . CARPAL TUNNEL RELEASE Right 1995  . CESAREAN SECTION    . CHOLECYSTECTOMY  06/2015  . TONSILLECTOMY      No family history on file. Social History:  reports that she has been smoking Cigarettes.  She has a 20.00 pack-year smoking history. She has never used smokeless tobacco. She reports that she does not use drugs. Her alcohol history is not on file.  Allergies:  Allergies  Allergen Reactions  . Hydrocodone Itching  . Polymox [Amoxicillin] Itching    Pt states she can tolerate amoxicillin, severe itching  Has patient had a PCN reaction causing immediate rash, facial/tongue/throat swelling, SOB or lightheadedness with hypotension: No Has patient had a PCN reaction causing severe rash involving mucus membranes or skin necrosis: No Has patient had a PCN reaction that required hospitalization: No Has patient had a PCN reaction occurring within the last 10 years: No If all of the above answers are "NO", then may proceed with Cephalosporin use.    Medications Prior to Admission  Medication Sig Dispense Refill  . baclofen (LIORESAL) 10 MG tablet Take 10 mg by mouth at bedtime as needed for muscle spasms.  0  . Calcium-Magnesium-Vitamin D (CALCIUM 1200+D3 PO) Take 1 tablet by mouth daily.     . Cholecalciferol (VITAMIN D3) 2000 units capsule Take 2,000 Units by mouth daily.    . INVOKANA 300 MG TABS tablet Take 300 mg by mouth daily.  2  . metFORMIN (GLUCOPHAGE-XR) 500 MG 24 hr tablet Take 500 mg by mouth daily.  5  . omeprazole (PRILOSEC) 20 MG capsule Take 20 mg by mouth 2 (two) times daily.    Marland Kitchen oxyCODONE-acetaminophen (PERCOCET) 7.5-325 MG tablet Take 1 tablet by mouth 4 (four) times daily as needed for pain.  0  . pravastatin (PRAVACHOL) 20 MG tablet Take 20 mg by mouth at bedtime.  1  . rOPINIRole (REQUIP) 4 MG tablet Take 8 mg by mouth 2 (two) times daily.  0  . meloxicam (MOBIC) 7.5 MG tablet Take 7.5 mg by mouth 2 (two) times daily.  2    Results for orders placed or performed during the hospital encounter of 01/24/17 (from the past 48 hour(s))  Glucose, capillary     Status: Abnormal   Collection Time: 01/24/17  6:19 AM  Result Value Ref Range   Glucose-Capillary 143 (H) 65 - 99 mg/dL   Comment 1 Notify RN    Comment 2 Document in Chart    No results found.  Pertinent items noted in HPI and remainder of comprehensive ROS otherwise negative.  Blood pressure 109/61, pulse 76, temperature 98.9 F (37.2 C), temperature source Oral, resp. rate 20, weight 120.2 kg (265 lb), SpO2 98 %.  Patient is awake and alert. She is oriented and appropriate. Speech is fluent. Judgment and insight are intact. Cranial nerve  function normal bilaterally. Motor examination the extremities 5/5 bilaterally. Sensory examination with bilateral L5 sensory decrease. Deep and reveals normal active except Achilles reflexes are absent bilaterally. No evidence for long track signs. Gait antalgic. Posture mildly flexed. Examination head ears eyes and throat unremarkable. Chest and abdomen are benign. Extremities are free from injury deformity. Assessment/Plan L5-S1 degenerative disc disease with foraminal stenosis and chronic back pain with radiculopathy. Plan bilateral L5-S1 decompressive  laminotomies with foraminotomies followed by posterior lumbar interbody fusion utilizing interbody cages, locally harvested autograft, and augmented with posterior lateral arthrodesis utilizing nonsegmental pedicle screw fixation and local autografting. Risks and benefits of been explained. Patient wishes to proceed.  Gavino Fouch A 01/24/2017, 7:41 AM

## 2017-01-24 NOTE — Progress Notes (Signed)
Pt somnolent & obtunded on arrival -airway maintained by Josh, CRNA- pt placed on face mask, then switched to a NRB- nasal trumpet at bedside ready to place prn. O2 Sats increased with NRB w/o chin lift

## 2017-01-24 NOTE — Brief Op Note (Signed)
01/24/2017  9:57 AM  PATIENT:  Carla GrapesFlora C Cadle  46 y.o. female  PRE-OPERATIVE DIAGNOSIS:  DDD  POST-OPERATIVE DIAGNOSIS:  DEGENERATIVE DISC DISEASE  PROCEDURE:  Procedure(s): POSTERIOR LUMBAR INTERBODY FUSION LUMBAR FIVE-SACRAL ONE (N/A)  SURGEON:  Surgeon(s) and Role:    * Kayode Petion, Sherilyn CooterHenry, MD - Primary    * Coletta Memosabbell, Kyle, MD - Assisting  PHYSICIAN ASSISTANT:   ASSISTANTS:    ANESTHESIA:   general  EBL:  Total I/O In: 1500 [I.V.:1500] Out: 400 [Urine:150; Blood:250]  BLOOD ADMINISTERED:none  DRAINS: none   LOCAL MEDICATIONS USED:  MARCAINE     SPECIMEN:  No Specimen  DISPOSITION OF SPECIMEN:  N/A  COUNTS:  YES  TOURNIQUET:  * No tourniquets in log *  DICTATION: .Dragon Dictation  PLAN OF CARE: Admit to inpatient   PATIENT DISPOSITION:  PACU - hemodynamically stable.   Delay start of Pharmacological VTE agent (>24hrs) due to surgical blood loss or risk of bleeding: yes

## 2017-01-24 NOTE — Anesthesia Preprocedure Evaluation (Signed)
Anesthesia Evaluation  Patient identified by MRN, date of birth, ID band Patient awake    Reviewed: Allergy & Precautions, NPO status , Patient's Chart, lab work & pertinent test results  Airway Mallampati: II  TM Distance: <3 FB Neck ROM: Full    Dental no notable dental hx.    Pulmonary neg pulmonary ROS, Current Smoker,    Pulmonary exam normal breath sounds clear to auscultation       Cardiovascular negative cardio ROS Normal cardiovascular exam Rhythm:Regular Rate:Normal     Neuro/Psych negative neurological ROS  negative psych ROS   GI/Hepatic negative GI ROS, Neg liver ROS,   Endo/Other  diabetesMorbid obesity  Renal/GU negative Renal ROS  negative genitourinary   Musculoskeletal negative musculoskeletal ROS (+)   Abdominal   Peds negative pediatric ROS (+)  Hematology negative hematology ROS (+)   Anesthesia Other Findings   Reproductive/Obstetrics negative OB ROS                             Anesthesia Physical Anesthesia Plan  ASA: III  Anesthesia Plan: General   Post-op Pain Management:    Induction: Intravenous  PONV Risk Score and Plan: 1 and Ondansetron and Dexamethasone  Airway Management Planned: Oral ETT  Additional Equipment:   Intra-op Plan:   Post-operative Plan: Extubation in OR  Informed Consent: I have reviewed the patients History and Physical, chart, labs and discussed the procedure including the risks, benefits and alternatives for the proposed anesthesia with the patient or authorized representative who has indicated his/her understanding and acceptance.   Dental advisory given  Plan Discussed with: CRNA and Surgeon  Anesthesia Plan Comments:         Anesthesia Quick Evaluation

## 2017-01-24 NOTE — Transfer of Care (Signed)
Immediate Anesthesia Transfer of Care Note  Patient: Carla Parker  Procedure(s) Performed: Procedure(s): POSTERIOR LUMBAR INTERBODY FUSION LUMBAR FIVE-SACRAL ONE (N/A)  Patient Location: PACU  Anesthesia Type:General  Level of Consciousness: drowsy and patient cooperative  Airway & Oxygen Therapy: Patient Spontanous Breathing and Patient connected to face mask oxygen  Post-op Assessment: Report given to RN, Post -op Vital signs reviewed and stable and Patient moving all extremities X 4  Post vital signs: Reviewed and stable  Last Vitals:  Vitals:   01/24/17 0614  BP: 109/61  Pulse: 76  Resp: 20  Temp: 37.2 C    Last Pain:  Vitals:   01/24/17 0636  TempSrc:   PainSc: 4          Complications: No apparent anesthesia complications

## 2017-01-25 LAB — GLUCOSE, CAPILLARY: GLUCOSE-CAPILLARY: 164 mg/dL — AB (ref 65–99)

## 2017-01-25 MED ORDER — DIAZEPAM 5 MG PO TABS: 5.0000 mg | ORAL_TABLET | Freq: Four times a day (QID) | ORAL | 0 refills | Status: AC | PRN

## 2017-01-25 MED ORDER — OXYCODONE-ACETAMINOPHEN 7.5-325 MG PO TABS: 1.0000 | ORAL_TABLET | Freq: Four times a day (QID) | ORAL | 0 refills | Status: AC | PRN

## 2017-01-25 MED FILL — Heparin Sodium (Porcine) Inj 1000 Unit/ML: INTRAMUSCULAR | Qty: 30 | Status: AC

## 2017-01-25 MED FILL — Sodium Chloride IV Soln 0.9%: INTRAVENOUS | Qty: 1000 | Status: AC

## 2017-01-25 NOTE — Progress Notes (Signed)
Discharge instructions printed and reviewed with patient and spouse, and copy given for them to take home. All questions addressed at this time. New prescriptions reviewed. Room searched for patient belongings, and confirmed with patient that all valuables were accounted for. Family assisted patient to dress, then staff escorted patient to discharge via wheelchair.  

## 2017-01-25 NOTE — Evaluation (Signed)
Occupational Therapy Evaluation Patient Details Name: Carla Parker MRN: [id number] DOB: [birth year] Today's Date: 01/25/2017    History of Present Illness Patient is a 46 y/o female who presents s/p L5-S1 PLIF. PMH includes DM.   Clinical Impression   PTA, pt was living with her husband and was independent. Currently, pt performing ADLs and functional mobility at supervision level. Provided education and handout on back precautions, LB ADLs, bed mobility, toilet transfer, and shower transfer with shower seat; pt verbalized and demonstrated understanding. Answered all pt questions. Recommend dc home once medically stable per physician. All acute OT needs met and will sign off. Thank you.    Follow Up Recommendations  DC plan and follow up therapy as arranged by surgeon;Supervision/Assistance - 24 hour    Equipment Recommendations  None recommended by OT    Recommendations for Other Services PT consult     Precautions / Restrictions Precautions Precautions: Back Precaution Booklet Issued: Yes (comment) Precaution Comments: Reviewed back precautions. Required Braces or Orthoses: Spinal Brace Spinal Brace: Lumbar corset;Applied in sitting position Restrictions Weight Bearing Restrictions: No      Mobility Bed Mobility Overal bed mobility: Needs Assistance Bed Mobility: Rolling;Sidelying to Sit Rolling: Supervision Sidelying to sit: Supervision;HOB elevated       General bed mobility comments: Reviewed log roll technique with bed set up to simulate home environment  Transfers Overall transfer level: Needs assistance Equipment used: None Transfers: Sit to/from Stand Sit to Stand: Supervision         General transfer comment: supervision for safety    Balance Overall balance assessment: Needs assistance Sitting-balance support: Feet supported;No upper extremity supported Sitting balance-Leahy Scale: Good Sitting balance - Comments: Able to donn brace with setup.    Standing balance support: During functional activity Standing balance-Leahy Scale: Fair Standing balance comment: Able to stand at sink and wash hands reaching outside BoS without difficulty.                            ADL either performed or assessed with clinical judgement   ADL Overall ADL's : Needs assistance/impaired Eating/Feeding: Set up;Sitting   Grooming: Supervision/safety;Set up;Standing   Upper Body Bathing: Set up;Sitting   Lower Body Bathing: Set up;Sit to/from stand;With caregiver independent assisting   Upper Body Dressing : Set up;Sitting Upper Body Dressing Details (indicate cue type and reason): Pt donned/doffed back brace with supervision Lower Body Dressing: Set up;Sit to/from stand   Toilet Transfer: Supervision/safety;Ambulation;Regular Pharmacist, hospital Details (indicate cue type and reason): Educated pt on shower transfer; pt verbalized understanding Functional mobility during ADLs: Supervision/safety General ADL Comments: Pt performing ADLs and functional mobility at supervision level. Upon arrival, pt dressed and walking with husband. Discussed LB ADLs and use of AE. pt verbalized understanding and demonstrated good technique to bring ankle to knee.      Vision         Perception     Praxis      Pertinent Vitals/Pain Pain Assessment: 0-10 Pain Score: 3  Pain Location: back Pain Descriptors / Indicators: Sore;Operative site guarding Pain Intervention(s): Monitored during session     Hand Dominance Right   Extremity/Trunk Assessment Upper Extremity Assessment Upper Extremity Assessment: Overall WFL for tasks assessed   Lower Extremity Assessment Lower Extremity Assessment: Defer to PT evaluation   Cervical / Trunk Assessment Cervical / Trunk Assessment: Other exceptions Cervical / Trunk Exceptions: s/p spine  surgery   Communication Communication Communication: No difficulties   Cognition  Arousal/Alertness: Awake/alert Behavior During Therapy: WFL for tasks assessed/performed Overall Cognitive Status: Within Functional Limits for tasks assessed                                     General Comments  Husband present throughout session. Provided back hand out     Exercises     Shoulder Instructions      Home Living Family/patient expects to be discharged to:: Private residence Living Arrangements: Spouse/significant other Available Help at Discharge: Family;Available 24 hours/day Type of Home: House Home Access: Stairs to enter [employer] of Steps: 2   Home Layout: One level     Bathroom Shower/Tub: Occupational psychologist: Standard     Home Equipment: Shower seat;Grab bars - tub/shower;Hand held shower head;Adaptive equipment Adaptive Equipment: Reacher        Prior Functioning/Environment Level of Independence: Independent        Comments: Works in [employer]- lots of sitting/standing.        OT Problem List: Decreased range of motion;Decreased activity tolerance;Decreased knowledge of use of DME or AE;Decreased knowledge of precautions;Pain      OT Treatment/Interventions:      OT Goals(Current goals can be found in the care plan section) Acute Rehab OT Goals Patient Stated Goal: to get back to walking on treadmill OT Goal Formulation: With patient Time For Goal Achievement: 02/08/17 Potential to Achieve Goals: Good  OT Frequency:     Barriers to D/C:            Co-evaluation              AM-PAC PT "6 Clicks" Daily Activity     Outcome Measure Help from another person eating meals?: None Help from another person taking care of personal grooming?: None Help from another person toileting, which includes using toliet, bedpan, or urinal?: A Little Help from another person bathing (including washing, rinsing, drying)?: A Little Help from another person to put on and taking off regular upper  body clothing?: None Help from another person to put on and taking off regular lower body clothing?: A Little 6 Click Score: 21   End of Session Equipment Utilized During Treatment: Back brace Nurse Communication: Mobility status;Patient requests pain meds  Activity Tolerance: Patient tolerated treatment well Patient left: with family/visitor present (At EOB)  OT Visit Diagnosis: Other abnormalities of gait and mobility (R26.89);Pain Pain - Right/Left:  (Back) Pain - part of body:  (Back)                Time: 3810-1751 OT Time Calculation (min): 9 min Charges:  OT General Charges $OT Visit: 1 Procedure OT Evaluation $OT Eval Low Complexity: 1 Procedure G-Codes:     [place], OTR/L Acute Rehab Pager: [phone] Office: [place] 01/25/2017, 8:34 AM

## 2017-01-25 NOTE — Discharge Summary (Signed)
Physician Discharge Summary  Patient ID: Carla DoffingFlora C Parker MRN: [id number] DOB/AGE: Dec 17, 1970 46 y.o.  Admit date: 01/24/2017 Discharge date: 01/25/2017  Admission Diagnoses:  Discharge Diagnoses:  Active Problems:   Lumbar foraminal stenosis   Discharged Condition: good  Hospital Course: Patient admitted to hospital where she underwent uncomplicated lumbar decompression and fusion. Postoperative doing well. She is ambulating without difficulty. Preoperative pain much improved. Ready for discharge home  Consults:   Significant Diagnostic Studies:   Treatments:   Discharge Exam: Blood pressure 121/73, pulse 71, temperature 98 F (36.7 C), temperature source Oral, resp. rate 20, weight 120.2 kg (265 lb), SpO2 100 %. Awake and alert. Oriented and appropriate. Motor and sensory function extremities intact. Wound clean and dry. Chest and abdomen benign.  Disposition: Final discharge disposition not confirmed   Allergies as of 01/25/2017      Reactions   Hydrocodone Itching   Polymox [amoxicillin] Itching   Pt states she can tolerate amoxicillin, severe itching Has patient had a PCN reaction causing immediate rash, facial/tongue/throat swelling, SOB or lightheadedness with hypotension: No Has patient had a PCN reaction causing severe rash involving mucus membranes or skin necrosis: No Has patient had a PCN reaction that required hospitalization: No Has patient had a PCN reaction occurring within the last 10 years: No If all of the above answers are "NO", then may proceed with Cephalosporin use.      Medication List    TAKE these medications   baclofen 10 MG tablet Commonly known as:  LIORESAL Take 10 mg by mouth at bedtime as needed for muscle spasms.   CALCIUM 1200+D3 PO Take 1 tablet by mouth daily.   diazepam 5 MG tablet Commonly known as:  VALIUM Take 1-2 tablets (5-10 mg total) by mouth every 6 (six) hours as needed for muscle spasms.   INVOKANA 300 MG Tabs  tablet Generic drug:  canagliflozin Take 300 mg by mouth daily.   meloxicam 7.5 MG tablet Commonly known as:  MOBIC Take 7.5 mg by mouth 2 (two) times daily.   metFORMIN 500 MG 24 hr tablet Commonly known as:  GLUCOPHAGE-XR Take 500 mg by mouth daily.   omeprazole 20 MG capsule Commonly known as:  PRILOSEC Take 20 mg by mouth 2 (two) times daily.   oxyCODONE-acetaminophen 7.5-325 MG tablet Commonly known as:  PERCOCET Take 1-2 tablets by mouth 4 (four) times daily as needed. What changed:  how much to take  reasons to take this   pravastatin 20 MG tablet Commonly known as:  PRAVACHOL Take 20 mg by mouth at bedtime.   rOPINIRole 4 MG tablet Commonly known as:  REQUIP Take 8 mg by mouth 2 (two) times daily.   Vitamin D3 2000 units capsule Take 2,000 Units by mouth daily.            Durable Medical Equipment        Start     Ordered   01/24/17 1221  DME Walker rolling  Once    Question:  Patient needs a walker to treat with the following condition  Answer:  Lumbar foraminal stenosis   01/24/17 1220   01/24/17 1221  DME 3 n 1  Once     01/24/17 1220       Signed: Trenna Kiely A 01/25/2017, 10:19 AM

## 2017-01-25 NOTE — Progress Notes (Signed)
Physical Therapy Treatment Patient Details Name: Carla Parker MRN: [id number] DOB: 03-Oct-1970 Today's Date: 01/25/2017    History of Present Illness Patient is a 46 y/o female who presents s/p L5-S1 PLIF. PMH includes DM.    PT Comments    Pt doing well with mobility and no further PT needed.  Ready for dc from PT standpoint. Pt asked about treadmill at home. I advised her to wait one week until her follow up with Dr. Annette Stable. Instructed her in mini squats with UE support and heel raises. Pt verbalized understanding.    Follow Up Recommendations  No PT follow up     Equipment Recommendations  None recommended by PT    Recommendations for Other Services       Precautions / Restrictions Precautions Precautions: Back Precaution Booklet Issued: Yes (comment) Precaution Comments: Reviewed back precautions. Required Braces or Orthoses: Spinal Brace Spinal Brace: Lumbar corset;Applied in sitting position Restrictions Weight Bearing Restrictions: No    Mobility  Bed Mobility Overal bed mobility: Modified Independent Bed Mobility: Rolling;Sidelying to Sit;Sit to Sidelying Rolling: Modified independent (Device/Increase time) Sidelying to sit: Modified independent (Device/Increase time)     Sit to sidelying: Modified independent (Device/Increase time) General bed mobility comments: performed with good technique  Transfers Overall transfer level: Modified independent Equipment used: None Transfers: Sit to/from Stand Sit to Stand: Modified independent (Device/Increase time)         General transfer comment: supervision for safety  Ambulation/Gait Ambulation/Gait assistance: Independent Ambulation Distance (Feet): 1000 Feet Assistive device: None Gait Pattern/deviations: WFL(Within Functional Limits)   Gait velocity interpretation: at or above normal speed for age/gender General Gait Details: Steady gait   Stairs Stairs: Yes   Stair Management: One rail  Right;Alternating pattern;Forwards Number of Stairs: 8    Wheelchair Mobility    Modified Rankin (Stroke Patients Only)       Balance Overall balance assessment: Independent Sitting-balance support: Feet supported;No upper extremity supported Sitting balance-Leahy Scale: Good Sitting balance - Comments: Able to donn brace with setup.   Standing balance support: During functional activity Standing balance-Leahy Scale: Fair Standing balance comment: Able to stand at sink and wash hands reaching outside BoS without difficulty.                             Cognition Arousal/Alertness: Awake/alert Behavior During Therapy: WFL for tasks assessed/performed Overall Cognitive Status: Within Functional Limits for tasks assessed                                        Exercises      General Comments General comments (skin integrity, edema, etc.): Husband present throughout session. Provided back hand out       Pertinent Vitals/Pain Pain Assessment: 0-10 Pain Score: 3  Pain Location: back Pain Descriptors / Indicators: Sore;Operative site guarding Pain Intervention(s): Monitored during session;Premedicated before session    [place] expects to be discharged to:: Private residence Living Arrangements: Spouse/significant other Available Help at Discharge: Family;Available 24 hours/day Type of Home: House Home Access: Stairs to enter   Home Layout: One level Home Equipment: Shower seat;Grab bars - tub/shower;Hand held shower head;Adaptive equipment      Prior Function Level of Independence: Independent      Comments: Works in [employer]- lots of sitting/standing.   PT Goals (current goals can now be found  in the care plan section) Acute Rehab PT Goals Patient Stated Goal: to get back to walking on treadmill Progress towards PT goals: Goals met/education completed, patient discharged from PT    Frequency            PT Plan Current plan remains appropriate    Co-evaluation              AM-PAC PT "6 Clicks" Daily Activity  Outcome Measure  Difficulty turning over in bed (including adjusting bedclothes, sheets and blankets)?: None Difficulty moving from lying on back to sitting on the side of the bed? : None Difficulty sitting down on and standing up from a chair with arms (e.g., wheelchair, bedside commode, etc,.)?: None Help needed moving to and from a bed to chair (including a wheelchair)?: None Help needed walking in hospital room?: None Help needed climbing 3-5 steps with a railing? : None 6 Click Score: 24    End of Session Equipment Utilized During Treatment: Back brace Activity Tolerance: Patient tolerated treatment well Patient left: in bed;with family/visitor present (sitting EOB) Nurse Communication: Mobility status PT Visit Diagnosis: Pain Pain - part of body:  (back)     Time: 9179-1505 PT Time Calculation (min) (ACUTE ONLY): 8 min  Charges:  $Gait Training: 8-22 mins                    G Codes:       [facility] PT [place] 01/25/2017, 10:00 AM

## 2017-01-25 NOTE — Discharge Instructions (Signed)

## 2017-02-09 DIAGNOSIS — Z79899 Other long term (current) drug therapy: Secondary | ICD-10-CM | POA: Diagnosis not present

## 2017-02-09 DIAGNOSIS — G894 Chronic pain syndrome: Secondary | ICD-10-CM | POA: Diagnosis not present

## 2017-02-09 DIAGNOSIS — Z79891 Long term (current) use of opiate analgesic: Secondary | ICD-10-CM | POA: Diagnosis not present

## 2017-02-09 DIAGNOSIS — M47817 Spondylosis without myelopathy or radiculopathy, lumbosacral region: Secondary | ICD-10-CM | POA: Diagnosis not present

## 2017-02-09 DIAGNOSIS — M5137 Other intervertebral disc degeneration, lumbosacral region: Secondary | ICD-10-CM | POA: Diagnosis not present

## 2017-02-25 DIAGNOSIS — M9983 Other biomechanical lesions of lumbar region: Secondary | ICD-10-CM | POA: Diagnosis not present

## 2017-03-09 DIAGNOSIS — M47817 Spondylosis without myelopathy or radiculopathy, lumbosacral region: Secondary | ICD-10-CM | POA: Diagnosis not present

## 2017-03-09 DIAGNOSIS — Z79891 Long term (current) use of opiate analgesic: Secondary | ICD-10-CM | POA: Diagnosis not present

## 2017-03-09 DIAGNOSIS — E785 Hyperlipidemia, unspecified: Secondary | ICD-10-CM | POA: Diagnosis not present

## 2017-03-09 DIAGNOSIS — I10 Essential (primary) hypertension: Secondary | ICD-10-CM | POA: Diagnosis not present

## 2017-03-09 DIAGNOSIS — Z79899 Other long term (current) drug therapy: Secondary | ICD-10-CM | POA: Diagnosis not present

## 2017-03-09 DIAGNOSIS — E1159 Type 2 diabetes mellitus with other circulatory complications: Secondary | ICD-10-CM | POA: Diagnosis not present

## 2017-03-09 DIAGNOSIS — M5137 Other intervertebral disc degeneration, lumbosacral region: Secondary | ICD-10-CM | POA: Diagnosis not present

## 2017-03-09 DIAGNOSIS — G894 Chronic pain syndrome: Secondary | ICD-10-CM | POA: Diagnosis not present

## 2017-03-09 DIAGNOSIS — E1165 Type 2 diabetes mellitus with hyperglycemia: Secondary | ICD-10-CM | POA: Diagnosis not present

## 2017-03-15 DIAGNOSIS — J302 Other seasonal allergic rhinitis: Secondary | ICD-10-CM | POA: Diagnosis not present

## 2017-03-15 DIAGNOSIS — J305 Allergic rhinitis due to food: Secondary | ICD-10-CM | POA: Diagnosis not present

## 2017-03-15 DIAGNOSIS — J3089 Other allergic rhinitis: Secondary | ICD-10-CM | POA: Diagnosis not present

## 2017-03-16 DIAGNOSIS — J302 Other seasonal allergic rhinitis: Secondary | ICD-10-CM | POA: Diagnosis not present

## 2017-03-16 DIAGNOSIS — J305 Allergic rhinitis due to food: Secondary | ICD-10-CM | POA: Diagnosis not present

## 2017-03-17 DIAGNOSIS — J305 Allergic rhinitis due to food: Secondary | ICD-10-CM | POA: Diagnosis not present

## 2017-03-17 DIAGNOSIS — J302 Other seasonal allergic rhinitis: Secondary | ICD-10-CM | POA: Diagnosis not present

## 2017-03-31 DIAGNOSIS — Z23 Encounter for immunization: Secondary | ICD-10-CM | POA: Diagnosis not present

## 2017-04-01 DIAGNOSIS — M9983 Other biomechanical lesions of lumbar region: Secondary | ICD-10-CM | POA: Diagnosis not present

## 2017-04-06 DIAGNOSIS — G894 Chronic pain syndrome: Secondary | ICD-10-CM | POA: Diagnosis not present

## 2017-04-06 DIAGNOSIS — M5137 Other intervertebral disc degeneration, lumbosacral region: Secondary | ICD-10-CM | POA: Diagnosis not present

## 2017-04-06 DIAGNOSIS — M47817 Spondylosis without myelopathy or radiculopathy, lumbosacral region: Secondary | ICD-10-CM | POA: Diagnosis not present

## 2017-04-06 DIAGNOSIS — Z79899 Other long term (current) drug therapy: Secondary | ICD-10-CM | POA: Diagnosis not present

## 2017-04-06 DIAGNOSIS — Z79891 Long term (current) use of opiate analgesic: Secondary | ICD-10-CM | POA: Diagnosis not present

## 2017-04-11 DIAGNOSIS — R928 Other abnormal and inconclusive findings on diagnostic imaging of breast: Secondary | ICD-10-CM | POA: Diagnosis not present

## 2017-04-11 DIAGNOSIS — N6002 Solitary cyst of left breast: Secondary | ICD-10-CM | POA: Diagnosis not present

## 2017-04-11 DIAGNOSIS — N6342 Unspecified lump in left breast, subareolar: Secondary | ICD-10-CM | POA: Diagnosis not present

## 2017-04-11 DIAGNOSIS — N643 Galactorrhea not associated with childbirth: Secondary | ICD-10-CM | POA: Diagnosis not present

## 2017-04-11 DIAGNOSIS — N6489 Other specified disorders of breast: Secondary | ICD-10-CM | POA: Diagnosis not present

## 2017-04-13 DIAGNOSIS — N643 Galactorrhea not associated with childbirth: Secondary | ICD-10-CM | POA: Diagnosis not present

## 2017-04-13 DIAGNOSIS — N6342 Unspecified lump in left breast, subareolar: Secondary | ICD-10-CM | POA: Diagnosis not present

## 2017-04-13 DIAGNOSIS — N6042 Mammary duct ectasia of left breast: Secondary | ICD-10-CM | POA: Diagnosis not present

## 2017-04-20 DIAGNOSIS — N6452 Nipple discharge: Secondary | ICD-10-CM | POA: Diagnosis not present

## 2017-04-29 DIAGNOSIS — M9983 Other biomechanical lesions of lumbar region: Secondary | ICD-10-CM | POA: Diagnosis not present

## 2017-05-06 DIAGNOSIS — M5137 Other intervertebral disc degeneration, lumbosacral region: Secondary | ICD-10-CM | POA: Diagnosis not present

## 2017-05-06 DIAGNOSIS — M47817 Spondylosis without myelopathy or radiculopathy, lumbosacral region: Secondary | ICD-10-CM | POA: Diagnosis not present

## 2017-05-06 DIAGNOSIS — G894 Chronic pain syndrome: Secondary | ICD-10-CM | POA: Diagnosis not present

## 2017-05-09 DIAGNOSIS — N6489 Other specified disorders of breast: Secondary | ICD-10-CM | POA: Diagnosis not present

## 2017-05-09 DIAGNOSIS — R928 Other abnormal and inconclusive findings on diagnostic imaging of breast: Secondary | ICD-10-CM | POA: Diagnosis not present

## 2017-05-09 DIAGNOSIS — N643 Galactorrhea not associated with childbirth: Secondary | ICD-10-CM | POA: Diagnosis not present

## 2017-05-12 ENCOUNTER — Other Ambulatory Visit: Payer: Self-pay | Admitting: Surgery

## 2017-05-12 DIAGNOSIS — N6452 Nipple discharge: Secondary | ICD-10-CM | POA: Diagnosis not present

## 2017-05-18 ENCOUNTER — Other Ambulatory Visit: Payer: Self-pay | Admitting: Surgery

## 2017-05-18 ENCOUNTER — Ambulatory Visit
Admission: RE | Admit: 2017-05-18 | Discharge: 2017-05-18 | Disposition: A | Discharge status: Home / Self Care | Payer: 59 | Source: Ambulatory Visit | Attending: Surgery | Admitting: Surgery

## 2017-05-18 DIAGNOSIS — N6452 Nipple discharge: Secondary | ICD-10-CM

## 2017-05-18 DIAGNOSIS — N6489 Other specified disorders of breast: Secondary | ICD-10-CM | POA: Diagnosis not present

## 2017-05-18 DIAGNOSIS — N6012 Diffuse cystic mastopathy of left breast: Secondary | ICD-10-CM | POA: Diagnosis not present

## 2017-05-18 MED ORDER — GADOBENATE DIMEGLUMINE 529 MG/ML IV SOLN
20.0000 mL | Freq: Once | INTRAVENOUS | Status: AC | PRN
  Administered 2017-05-18: 20 mL via INTRAVENOUS

## 2017-05-27 DIAGNOSIS — N6452 Nipple discharge: Secondary | ICD-10-CM | POA: Diagnosis not present

## 2017-06-01 DIAGNOSIS — J3089 Other allergic rhinitis: Secondary | ICD-10-CM | POA: Diagnosis not present

## 2017-08-18 DIAGNOSIS — J0100 Acute maxillary sinusitis, unspecified: Secondary | ICD-10-CM | POA: Diagnosis not present

## 2017-10-29 DIAGNOSIS — R21 Rash and other nonspecific skin eruption: Secondary | ICD-10-CM | POA: Diagnosis not present

## 2017-10-31 DIAGNOSIS — E782 Mixed hyperlipidemia: Secondary | ICD-10-CM | POA: Diagnosis not present

## 2017-10-31 DIAGNOSIS — L564 Polymorphous light eruption: Secondary | ICD-10-CM | POA: Diagnosis not present

## 2017-10-31 DIAGNOSIS — E1169 Type 2 diabetes mellitus with other specified complication: Secondary | ICD-10-CM | POA: Diagnosis not present

## 2017-11-14 DIAGNOSIS — E1169 Type 2 diabetes mellitus with other specified complication: Secondary | ICD-10-CM | POA: Diagnosis not present

## 2017-11-14 DIAGNOSIS — E782 Mixed hyperlipidemia: Secondary | ICD-10-CM | POA: Diagnosis not present

## 2017-11-14 DIAGNOSIS — R062 Wheezing: Secondary | ICD-10-CM | POA: Diagnosis not present

## 2017-12-16 DIAGNOSIS — E1140 Type 2 diabetes mellitus with diabetic neuropathy, unspecified: Secondary | ICD-10-CM | POA: Diagnosis not present

## 2017-12-16 DIAGNOSIS — J449 Chronic obstructive pulmonary disease, unspecified: Secondary | ICD-10-CM | POA: Diagnosis not present

## 2018-01-13 DIAGNOSIS — E1140 Type 2 diabetes mellitus with diabetic neuropathy, unspecified: Secondary | ICD-10-CM | POA: Diagnosis not present

## 2018-03-15 DIAGNOSIS — E1159 Type 2 diabetes mellitus with other circulatory complications: Secondary | ICD-10-CM | POA: Diagnosis not present

## 2018-03-15 DIAGNOSIS — E1169 Type 2 diabetes mellitus with other specified complication: Secondary | ICD-10-CM | POA: Diagnosis not present

## 2018-03-15 DIAGNOSIS — E1140 Type 2 diabetes mellitus with diabetic neuropathy, unspecified: Secondary | ICD-10-CM | POA: Diagnosis not present

## 2018-03-23 DIAGNOSIS — I10 Essential (primary) hypertension: Secondary | ICD-10-CM | POA: Diagnosis not present

## 2018-03-23 DIAGNOSIS — E782 Mixed hyperlipidemia: Secondary | ICD-10-CM | POA: Diagnosis not present

## 2018-03-23 DIAGNOSIS — E1159 Type 2 diabetes mellitus with other circulatory complications: Secondary | ICD-10-CM | POA: Diagnosis not present

## 2018-03-23 DIAGNOSIS — E1169 Type 2 diabetes mellitus with other specified complication: Secondary | ICD-10-CM | POA: Diagnosis not present

## 2018-07-12 DIAGNOSIS — E1159 Type 2 diabetes mellitus with other circulatory complications: Secondary | ICD-10-CM | POA: Diagnosis not present

## 2018-07-12 DIAGNOSIS — E1140 Type 2 diabetes mellitus with diabetic neuropathy, unspecified: Secondary | ICD-10-CM | POA: Diagnosis not present

## 2018-07-12 DIAGNOSIS — E1169 Type 2 diabetes mellitus with other specified complication: Secondary | ICD-10-CM | POA: Diagnosis not present

## 2019-04-01 IMAGING — RF DG C-ARM 61-120 MIN
1 series · 2 of 2 positions shown · non-contrast
Comparison: Lumbar spine MRI 05/16/2016

CLINICAL DATA: L5-S1 fusion.

EXAM:
DG C-ARM 61-120 MIN; LUMBAR SPINE - 2-3 VIEW

[Series 1: run · 2 of 2 slices shown]
[im 1/2]
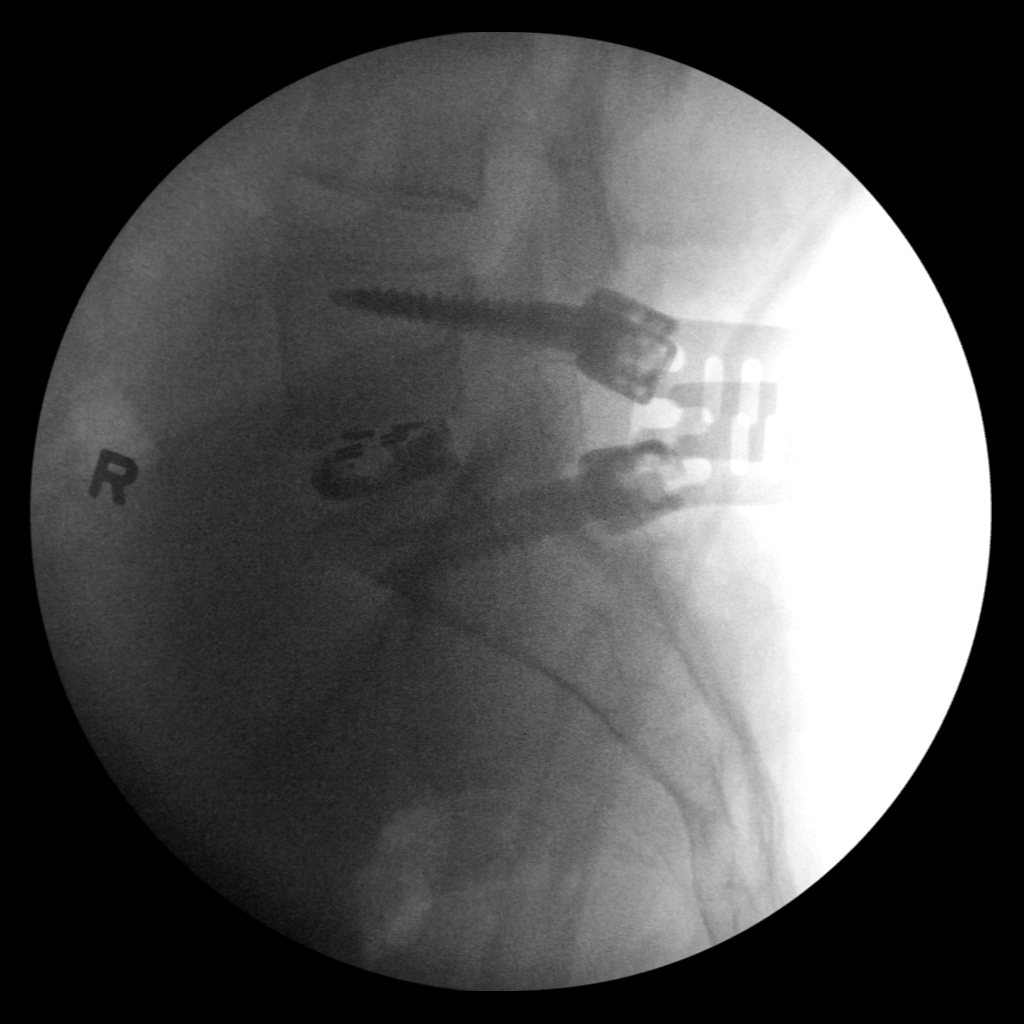
[im 2/2]
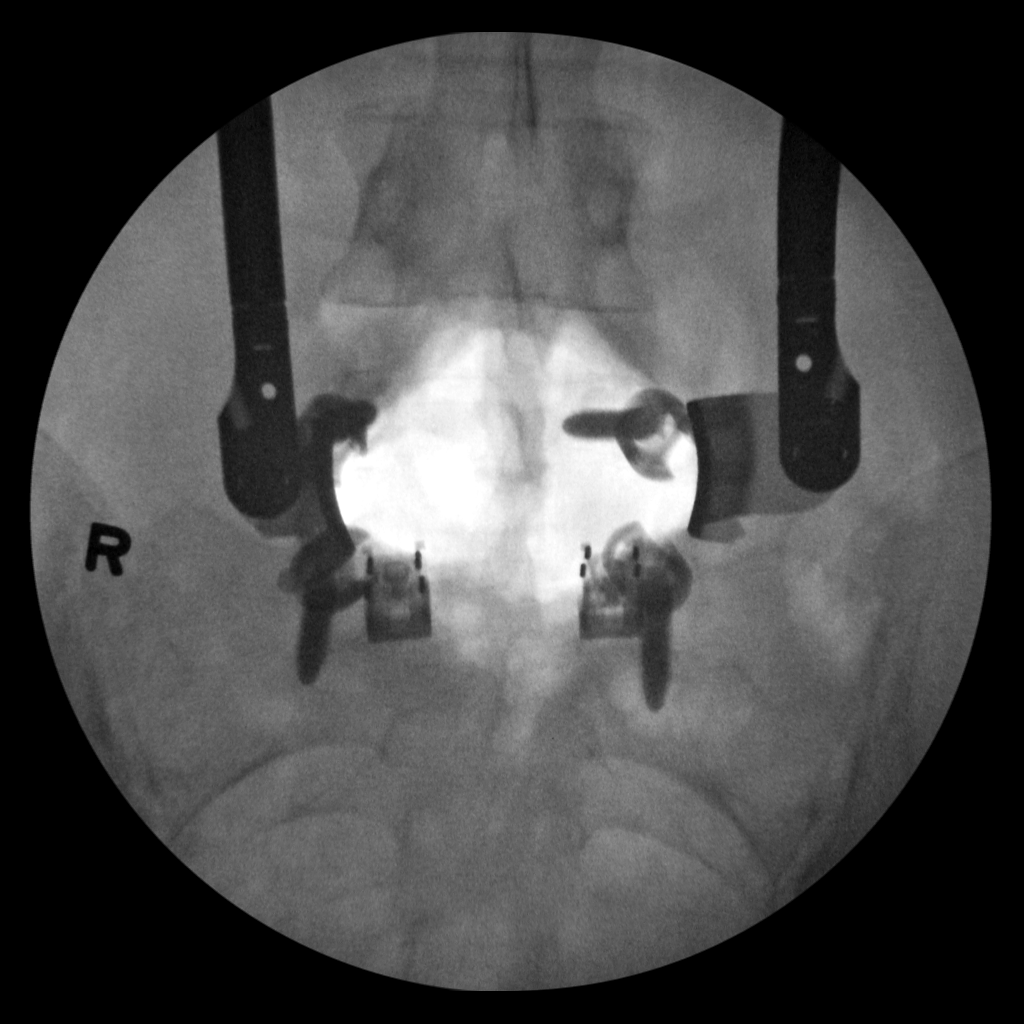

[2 of 2 positions shown; findings below may reference images not displayed]

FINDINGS: Intraoperative spot films demonstrate well-positioned pedicle screws
at L4 and L5 and an interbody fusion device at L5-S1.
IMPRESSION: Well position hardware at L4-5 without complicating features.

## 2021-08-13 ENCOUNTER — Encounter: Payer: Self-pay | Admitting: Emergency Medicine

## 2021-08-13 ENCOUNTER — Ambulatory Visit (INDEPENDENT_AMBULATORY_CARE_PROVIDER_SITE_OTHER): Payer: BC Managed Care – PPO

## 2021-08-13 ENCOUNTER — Ambulatory Visit
Admission: EM | Admit: 2021-08-13 | Discharge: 2021-08-13 | Disposition: A | Discharge status: Home / Self Care | Payer: BC Managed Care – PPO | Attending: Internal Medicine | Admitting: Internal Medicine

## 2021-08-13 ENCOUNTER — Other Ambulatory Visit: Payer: Self-pay

## 2021-08-13 DIAGNOSIS — S92511A Displaced fracture of proximal phalanx of right lesser toe(s), initial encounter for closed fracture: Secondary | ICD-10-CM

## 2021-08-13 NOTE — ED Triage Notes (Signed)
Patient states that she stubbed her right 3rd/4th toes a couple of weeks ago against her bed.  Patient has buddy taped the toes, applying ice, just no better.

## 2021-08-13 NOTE — Discharge Instructions (Signed)
You have a fracture of your toe.  Please follow-up with provided contact information for orthopedist for further evaluation and management.

## 2021-08-13 NOTE — ED Provider Notes (Signed)
[name]    CSN: [id number] Arrival date & time: 08/13/21  1007      History   Chief Complaint Chief Complaint  Patient presents with   Foot Pain    HPI Carla Parker is a 51 y.o. female.   Patient presents with right fourth digit pain to right foot that started a couple weeks ago after an injury.  Patient reports that she hit her foot on a chair, and then she hit her foot on the staircase in the same day.  Patient has been using buddy tape, applying ice to affected area, and taking Aleve with minimal improvement.  Denies any numbness or tingling. She is able to bear weight.    Foot Pain   Past Medical History:  Diagnosis Date   Arthritis    Asthma    actvity induced asthma   Diabetes mellitus without complication (HCC)    GERD (gastroesophageal reflux disease)     Patient Active Problem List   Diagnosis Date Noted   Lumbar foraminal stenosis 01/24/2017    Past Surgical History:  Procedure Laterality Date   CARPAL TUNNEL RELEASE Right 1995   CESAREAN SECTION     CHOLECYSTECTOMY  06/2015   TONSILLECTOMY      OB History   No obstetric history on file.      Home Medications    Prior to Admission medications   Medication Sig Start Date End Date Taking? Authorizing Provider  omeprazole (PRILOSEC) 20 MG capsule Take 20 mg by mouth 2 (two) times daily.   Yes [provider]  rOPINIRole (REQUIP) 4 MG tablet Take 8 mg by mouth 2 (two) times daily. 12/03/16  Yes [provider]  baclofen (LIORESAL) 10 MG tablet Take 10 mg by mouth at bedtime as needed for muscle spasms. 12/15/16   [provider]  Calcium-Magnesium-Vitamin D (CALCIUM 1200+D3 PO) Take 1 tablet by mouth daily.    [provider]  Cholecalciferol (VITAMIN D3) 2000 units capsule Take 2,000 Units by mouth daily.    [provider]  diazepam (VALIUM) 5 MG tablet Take 1-2 tablets (5-10 mg total) by mouth every 6 (six) hours as needed for muscle  spasms. 01/25/17   Julio SicksPool, Henry, MD  INVOKANA 300 MG TABS tablet Take 300 mg by mouth daily. 12/02/16   [provider]  meloxicam (MOBIC) 7.5 MG tablet Take 7.5 mg by mouth 2 (two) times daily. 12/02/16   [provider]  metFORMIN (GLUCOPHAGE-XR) 500 MG 24 hr tablet Take 500 mg by mouth daily. 12/02/16   [provider]  oxyCODONE-acetaminophen (PERCOCET) 7.5-325 MG tablet Take 1-2 tablets by mouth 4 (four) times daily as needed. 01/25/17   Julio SicksPool, Henry, MD  pravastatin (PRAVACHOL) 20 MG tablet Take 20 mg by mouth at bedtime. 12/13/16   [provider]    Family History No family history on file.  Social History Social History   Tobacco Use   Smoking status: Every Day    Packs/day: 1.00    Years: 20.00    Pack years: 20.00    Types: Cigarettes   Smokeless tobacco: Never  Substance Use Topics   Drug use: No     Allergies   Hydrocodone and Polymox [amoxicillin]   Review of Systems Review of Systems Per HPI  Physical Exam Triage Vital Signs ED Triage Vitals  Enc Vitals Group     BP 08/13/21 1044 140/78     Pulse Rate 08/13/21 1044 95  Resp 08/13/21 1044 18     Temp 08/13/21 1044 97.9 F (36.6 C)     Temp Source 08/13/21 1044 Oral     SpO2 08/13/21 1044 95 %     Weight 08/13/21 1045 270 lb (122.5 kg)     Height 08/13/21 1045 5\' 9"  (1.753 m)     Head Circumference --      Peak Flow --      Pain Score 08/13/21 1045 2     Pain Loc --      Pain Edu? --      Excl. in GC? --    No data found.  Updated Vital Signs BP 140/78 (BP Location: Left Arm)    Pulse 95    Temp 97.9 F (36.6 C) (Oral)    Resp 18    Ht 5\' 9"  (1.753 m)    Wt 270 lb (122.5 kg)    LMP 12/14/2016    SpO2 95%    BMI 39.87 kg/m   Visual Acuity Right Eye Distance:   Left Eye Distance:   Bilateral Distance:    Right Eye Near:   Left Eye Near:    Bilateral Near:     Physical Exam Constitutional:      General: She is not in acute distress.    Appearance: Normal  appearance. She is not toxic-appearing or diaphoretic.  HENT:     Head: Normocephalic and atraumatic.  Eyes:     Extraocular Movements: Extraocular movements intact.     Conjunctiva/sclera: Conjunctivae normal.  Pulmonary:     Effort: Pulmonary effort is normal.  Musculoskeletal:     Right foot: Normal capillary refill. Swelling, tenderness and bony tenderness present. No deformity or crepitus. Normal pulse.     Left foot: Normal.     Comments: Tenderness to palpation generalized throughout right fourth toe.  Mild bruising and swelling noted as well.  Neurovascular intact.  Has full range of motion of toes.  Neurological:     General: No focal deficit present.     Mental Status: She is alert and oriented to person, place, and time. Mental status is at baseline.  Psychiatric:        Mood and Affect: Mood normal.        Behavior: Behavior normal.        Thought Content: Thought content normal.        Judgment: Judgment normal.     UC Treatments / Results  Labs (all labs ordered are listed, but only abnormal results are displayed) Labs Reviewed - No data to display  EKG   Radiology DG Foot Complete Right  Result Date: 08/13/2021 CLINICAL DATA:  Stubbed third and fourth toes a few weeks ago. EXAM: RIGHT FOOT COMPLETE - 3+ VIEW COMPARISON:  None. FINDINGS: Acute minimally displaced fracture of the fourth proximal phalanx shaft. No intra-articular extension. No additional fracture. No dislocation. Joint spaces are preserved. 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 Bone mineralization is normal. Soft tissue swelling of fourth toe. IMPRESSION: 1. Acute minimally displaced fracture of the fourth proximal phalanx. Electronically Signed   By: 12/16/2016 M.D.   On: 08/13/2021 11:52    Procedures Procedures (including critical care time)  Medications Ordered in UC Medications - No data to display  Initial Impression / Assessment and Plan / UC  Course  I have reviewed the triage vital signs and the nursing notes.  Pertinent labs & imaging results that were available during my  care of the patient were reviewed by me and considered in my medical decision making (see chart for details).     Right foot x-ray does show proximal phalanx fracture of right fourth digit.  Fracture is minimally displaced so do not think that patient is in need of immediate orthopedic evaluation given duration since injury occurred, no neurovascular compromise, and x-ray result.  Patient already using buddy tape and is applied in urgent care.  Discussed supportive care and ice application.  Provided patient with contact information for orthopedist as she will need to follow-up with them.  Patient verbalized understanding and was agreeable with plan. Final Clinical Impressions(s) / UC Diagnoses   Final diagnoses:  Closed displaced fracture of proximal phalanx of lesser toe of right foot, initial encounter     Discharge Instructions      You have a fracture of your toe.  Please follow-up with provided contact information for orthopedist for further evaluation and management.     ED Prescriptions   None    PDMP not reviewed this encounter.   Gustavus Bryant, [place] 08/13/21 1224
